# Patient Record
Sex: Female | Born: 1980 | Race: White | Hispanic: No | Marital: Married | Smoking: Never smoker
Health system: Southern US, Community
[De-identification: ages and names within clinical notes are randomized; demographics above are authoritative.]

## PROBLEM LIST (undated history)

## (undated) DIAGNOSIS — F419 Anxiety disorder, unspecified: Secondary | ICD-10-CM

## (undated) DIAGNOSIS — E282 Polycystic ovarian syndrome: Secondary | ICD-10-CM

## (undated) DIAGNOSIS — F32A Depression, unspecified: Secondary | ICD-10-CM

## (undated) DIAGNOSIS — Z8742 Personal history of other diseases of the female genital tract: Secondary | ICD-10-CM

## (undated) DIAGNOSIS — R011 Cardiac murmur, unspecified: Secondary | ICD-10-CM

## (undated) DIAGNOSIS — D649 Anemia, unspecified: Secondary | ICD-10-CM

## (undated) DIAGNOSIS — R519 Headache, unspecified: Secondary | ICD-10-CM

## (undated) DIAGNOSIS — K219 Gastro-esophageal reflux disease without esophagitis: Secondary | ICD-10-CM

## (undated) DIAGNOSIS — R51 Headache: Secondary | ICD-10-CM

## (undated) DIAGNOSIS — F329 Major depressive disorder, single episode, unspecified: Secondary | ICD-10-CM

## (undated) DIAGNOSIS — G932 Benign intracranial hypertension: Secondary | ICD-10-CM

## (undated) DIAGNOSIS — O358XX0 Maternal care for other (suspected) fetal abnormality and damage, not applicable or unspecified: Secondary | ICD-10-CM

## (undated) DIAGNOSIS — I10 Essential (primary) hypertension: Secondary | ICD-10-CM

## (undated) HISTORY — PX: GASTRECTOMY: SHX58

## (undated) HISTORY — PX: CHOLECYSTECTOMY: SHX55

---
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
## 2002-07-07 ENCOUNTER — Other Ambulatory Visit: Admission: RE | Admit: 2002-07-07 | Discharge: 2002-07-07 | Payer: Self-pay | Admitting: Gynecology

## 2003-07-22 ENCOUNTER — Emergency Department (HOSPITAL_COMMUNITY): Admission: EM | Admit: 2003-07-22 | Discharge: 2003-07-22 | Payer: Self-pay | Admitting: Emergency Medicine

## 2003-08-09 ENCOUNTER — Other Ambulatory Visit: Admission: RE | Admit: 2003-08-09 | Discharge: 2003-08-09 | Payer: Self-pay | Admitting: Gynecology

## 2004-08-15 ENCOUNTER — Other Ambulatory Visit: Admission: RE | Admit: 2004-08-15 | Discharge: 2004-08-15 | Payer: Self-pay | Admitting: Gynecology

## 2005-07-28 ENCOUNTER — Emergency Department (HOSPITAL_COMMUNITY): Admission: EM | Admit: 2005-07-28 | Discharge: 2005-07-28 | Payer: Self-pay | Admitting: *Deleted

## 2007-05-16 ENCOUNTER — Ambulatory Visit: Payer: Self-pay | Admitting: Internal Medicine

## 2007-05-16 DIAGNOSIS — F3289 Other specified depressive episodes: Secondary | ICD-10-CM | POA: Insufficient documentation

## 2007-05-16 DIAGNOSIS — F329 Major depressive disorder, single episode, unspecified: Secondary | ICD-10-CM

## 2007-05-16 DIAGNOSIS — I10 Essential (primary) hypertension: Secondary | ICD-10-CM | POA: Insufficient documentation

## 2007-05-16 DIAGNOSIS — F411 Generalized anxiety disorder: Secondary | ICD-10-CM | POA: Insufficient documentation

## 2007-06-13 ENCOUNTER — Ambulatory Visit: Payer: Self-pay | Admitting: Internal Medicine

## 2007-08-11 ENCOUNTER — Encounter: Payer: Self-pay | Admitting: Internal Medicine

## 2007-09-07 ENCOUNTER — Ambulatory Visit: Payer: Self-pay | Admitting: Internal Medicine

## 2007-09-07 DIAGNOSIS — G932 Benign intracranial hypertension: Secondary | ICD-10-CM | POA: Insufficient documentation

## 2007-09-22 ENCOUNTER — Encounter: Payer: Self-pay | Admitting: Internal Medicine

## 2007-12-06 ENCOUNTER — Ambulatory Visit: Payer: Self-pay | Admitting: Internal Medicine

## 2008-01-03 ENCOUNTER — Ambulatory Visit: Payer: Self-pay | Admitting: Internal Medicine

## 2008-02-06 ENCOUNTER — Ambulatory Visit: Payer: Self-pay | Admitting: Internal Medicine

## 2008-04-09 HISTORY — PX: CHOLECYSTECTOMY: SHX55

## 2008-05-08 ENCOUNTER — Ambulatory Visit: Payer: Self-pay | Admitting: Internal Medicine

## 2008-05-08 DIAGNOSIS — G4700 Insomnia, unspecified: Secondary | ICD-10-CM | POA: Insufficient documentation

## 2008-05-14 ENCOUNTER — Encounter (INDEPENDENT_AMBULATORY_CARE_PROVIDER_SITE_OTHER): Payer: Self-pay | Admitting: Surgery

## 2008-05-14 ENCOUNTER — Observation Stay (HOSPITAL_COMMUNITY): Admission: EM | Admit: 2008-05-14 | Discharge: 2008-05-15 | Payer: Self-pay | Admitting: Emergency Medicine

## 2008-05-20 ENCOUNTER — Inpatient Hospital Stay (HOSPITAL_COMMUNITY): Admission: EM | Admit: 2008-05-20 | Discharge: 2008-05-22 | Payer: Self-pay | Admitting: Emergency Medicine

## 2008-06-06 ENCOUNTER — Encounter: Payer: Self-pay | Admitting: Internal Medicine

## 2008-06-07 ENCOUNTER — Ambulatory Visit: Payer: Self-pay | Admitting: Internal Medicine

## 2008-06-13 ENCOUNTER — Encounter: Payer: Self-pay | Admitting: Internal Medicine

## 2008-06-27 ENCOUNTER — Telehealth: Payer: Self-pay | Admitting: Internal Medicine

## 2008-07-04 ENCOUNTER — Ambulatory Visit: Payer: Self-pay | Admitting: Internal Medicine

## 2008-07-04 DIAGNOSIS — R519 Headache, unspecified: Secondary | ICD-10-CM | POA: Insufficient documentation

## 2008-07-04 DIAGNOSIS — R51 Headache: Secondary | ICD-10-CM | POA: Insufficient documentation

## 2008-10-25 ENCOUNTER — Ambulatory Visit: Payer: Self-pay | Admitting: Internal Medicine

## 2009-04-25 ENCOUNTER — Ambulatory Visit: Payer: Self-pay | Admitting: Internal Medicine

## 2009-04-25 LAB — CONVERTED CEMR LAB
ALT: 23 units/L (ref 0–35)
BUN: 12 mg/dL (ref 6–23)
Basophils Absolute: 0 10*3/uL (ref 0.0–0.1)
Bilirubin, Direct: 0 mg/dL (ref 0.0–0.3)
Cholesterol: 186 mg/dL (ref 0–200)
Creatinine, Ser: 0.8 mg/dL (ref 0.4–1.2)
Eosinophils Relative: 1.3 % (ref 0.0–5.0)
GFR calc non Af Amer: 91.48 mL/min (ref >60)
Glucose, Bld: 94 mg/dL (ref 70–99)
HCT: 45.9 % (ref 36.0–46.0)
HDL: 43 mg/dL (ref >39.00)
LDL Cholesterol: 113 mg/dL — ABNORMAL HIGH (ref 0–99)
Lymphs Abs: 1.7 10*3/uL (ref 0.7–4.0)
MCV: 90.7 fL (ref 78.0–100.0)
Monocytes Absolute: 0.4 10*3/uL (ref 0.1–1.0)
Monocytes Relative: 6 % (ref 3.0–12.0)
Neutrophils Relative %: 64.6 % (ref 43.0–77.0)
Platelets: 216 10*3/uL (ref 150.0–400.0)
RDW: 12.7 % (ref 11.5–14.6)
TSH: 1.57 microintl units/mL (ref 0.35–5.50)
Total Bilirubin: 0.5 mg/dL (ref 0.3–1.2)
Triglycerides: 151 mg/dL — ABNORMAL HIGH (ref 0.0–149.0)
VLDL: 30.2 mg/dL (ref 0.0–40.0)
WBC: 6 10*3/uL (ref 4.5–10.5)

## 2009-07-23 ENCOUNTER — Encounter: Payer: Self-pay | Admitting: Internal Medicine

## 2009-09-16 ENCOUNTER — Encounter: Payer: Self-pay | Admitting: Internal Medicine

## 2009-11-21 ENCOUNTER — Encounter: Payer: Self-pay | Admitting: Internal Medicine

## 2009-12-19 ENCOUNTER — Encounter: Payer: Self-pay | Admitting: Internal Medicine

## 2010-04-08 NOTE — Letter (Signed)
[facility]: [facility]   Imported By: Maryln Gottron 12/30/2009 15:29:28  _____________________________________________________________________  External Attachment:    Type:   Image     Comment:   External Document

## 2010-04-08 NOTE — Letter (Signed)
Summary: [facility]  [facility]   Imported By: Maryln Gottron 09/20/2009 14:18:19  _____________________________________________________________________  External Attachment:    Type:   Image     Comment:   External Document

## 2010-04-08 NOTE — Letter (Signed)
Summary: [facility]  [facility]   Imported By: Maryln Gottron 09/23/2009 14:32:32  _____________________________________________________________________  External Attachment:    Type:   Image     Comment:   External Document

## 2010-04-08 NOTE — Assessment & Plan Note (Signed)
Summary: 6 MONTH ROV/NJR   Vital Signs:  Patient profile:   30 year old female Weight:      266 pounds Temp:     98.6 degrees F oral BP sitting:   132 / 90  (right arm) Cuff size:   large  Vitals Entered By: Duard Brady LPN (April 25, 2009 8:17 AM) CC: 6 mos ROV - needs medication Refills - c/o l ear pain   CC:  6 mos ROV - needs medication Refills - c/o l ear pain.  History of Present Illness: 30 year old patient who is in today for follow-up.  she is followed by ophthalmology for benign intracranial hypertension.  She is scheduled for a follow-up in a couple of months.  She is also followed by psychiatry for depression.  She has treated hypertension.  She is on diuretic therapy.  That includes a thiazide as well as Diamox.  She feels a bit unwell today with some vague ear discomfort and general sense of unwellness, but general has been fairly stable.  no complaints of headache  Preventive Screening-Counseling & Management  Alcohol-Tobacco     Smoking Status: never  Allergies: No Known Drug Allergies  Past History:  Past Medical History: Hypertension history of pseudotumor cerebri; LP performed in 2007 negative ([facility]) Anxiety Depression history panic disorder obesity  Past Surgical History: Reviewed history from 06/07/2008 and no changes required. negative status post laparoscopic cholecystectomy March 2010  Family History: father age 35 has coronary artery disease, hypertension, diabetes, dyslipidemia mother, age 75, is in good health as is one brother, age 58  Social History: Reviewed history from 05/16/2007 and no changes required. Single graduate at [employer]; started at [employer] and completed her graduate studies at [employer]oking Status:  never  Review of Systems  The patient denies anorexia, fever, weight loss, weight gain, vision loss, decreased hearing, hoarseness, chest pain, syncope, dyspnea on exertion, peripheral edema,  prolonged cough, headaches, hemoptysis, abdominal pain, melena, hematochezia, severe indigestion/heartburn, hematuria, incontinence, genital sores, muscle weakness, suspicious skin lesions, transient blindness, difficulty walking, depression, unusual weight change, abnormal bleeding, enlarged lymph nodes, angioedema, and breast masses.    Physical Exam  General:  overweight-appearing.  130/90overweight-appearing.   Head:  Normocephalic and atraumatic without obvious abnormalities. No apparent alopecia or balding. Eyes:  fundi with  slightly indistinct borders Ears:  External ear exam shows no significant lesions or deformities.  Otoscopic examination reveals clear canals, tympanic membranes are intact bilaterally without bulging, retraction, inflammation or discharge. Hearing is grossly normal bilaterally. Mouth:  pharyngeal erythema.  pharyngeal erythema.   Neck:  No deformities, masses, or tenderness noted. Lungs:  Normal respiratory effort, chest expands symmetrically. Lungs are clear to auscultation, no crackles or wheezes. Heart:  Normal rate and regular rhythm. S1 and S2 normal without gallop, murmur, click, rub or other extra sounds. Msk:  No deformity or scoliosis noted of thoracic or lumbar spine.   Pulses:  R and L carotid,radial,femoral,dorsalis pedis and posterior tibial pulses are full and equal bilaterally Extremities:  No clubbing, cyanosis, edema, or deformity noted with normal full range of motion of all joints.     Impression & Recommendations:  Problem # 1:  DEPRESSION (ICD-311)  Her updated medication list for this problem includes:    Lorazepam 0.5 Mg Tabs (Lorazepam) ..... One tablet once or twice daily    Her updated medication list for this problem includes:    Lorazepam 0.5 Mg Tabs (Lorazepam) ..... One tablet once or twice daily  Orders:  Venipuncture (04540) TLB-Lipid Panel (80061-LIPID) TLB-BMP (Basic Metabolic Panel-BMET) (80048-METABOL) TLB-CBC Platelet -  w/Differential (85025-CBCD) TLB-Hepatic/Liver Function Pnl (80076-HEPATIC) TLB-TSH (Thyroid Stimulating Hormone) (84443-TSH)  Problem # 2:  HYPERTENSION (ICD-401.9)  Her updated medication list for this problem includes:    Acetazolamide 500 Mg Xr12h-cap (Acetazolamide) ..... One daily    Her updated medication list for this problem includes:    Acetazolamide 500 Mg Xr12h-cap (Acetazolamide) ..... One daily  Orders: Venipuncture (98119) TLB-Lipid Panel (80061-LIPID) TLB-BMP (Basic Metabolic Panel-BMET) (80048-METABOL) TLB-CBC Platelet - w/Differential (85025-CBCD) TLB-Hepatic/Liver Function Pnl (80076-HEPATIC) TLB-TSH (Thyroid Stimulating Hormone) (84443-TSH)  Problem # 3:  BENIGN INTRACRANIAL HYPERTENSION (ICD-348.2)  follow Dr. Hazle Quant  Orders: Venipuncture (14782) TLB-Lipid Panel (80061-LIPID) TLB-BMP (Basic Metabolic Panel-BMET) (80048-METABOL) TLB-CBC Platelet - w/Differential (85025-CBCD) TLB-Hepatic/Liver Function Pnl (80076-HEPATIC) TLB-TSH (Thyroid Stimulating Hormone) (84443-TSH)  Complete Medication List: 1)  Hydrochlorothiazide 25 Mg Caps (hydrochlorothiazide)  .Marland Kitchen.. 1 once daily 2)  Lorazepam 0.5 Mg Tabs (Lorazepam) .... One tablet once or twice daily 3)  Acetazolamide 500 Mg Xr12h-cap (Acetazolamide) .... One daily 4)  Lamictal 100 Mg Tabs (Lamotrigine) .Marland Kitchen.. 1 once daily  Patient Instructions: 1)  Please schedule a follow-up appointment in 6 months. 2)  Limit your Sodium (Salt). 3)  It is important that you exercise regularly at least 20 minutes 5 times a week. If you develop chest pain, have severe difficulty breathing, or feel very tired , stop exercising immediately and seek medical attention. 4)  You need to lose weight. Consider a lower calorie diet and regular exercise.  5)  Check your Blood Pressure regularly. If it is above: 150/90  you should make an appointment. Prescriptions: ACETAZOLAMIDE 500 MG XR12H-CAP (ACETAZOLAMIDE) one daily  #90 x 6    Entered and Authorized by:   Gordy Savers  MD   Signed by:   Gordy Savers  MD on 04/25/2009   Method used:   Print then Give to Patient   RxID:   [id number] LORAZEPAM 0.5 MG TABS (LORAZEPAM) one tablet once or twice daily  #60 x 2   Entered and Authorized by:   Gordy Savers  MD   Signed by:   Gordy Savers  MD on 04/25/2009   Method used:   Print then Give to Patient   RxID:   [id number] HYDROCHLOROTHIAZIDE 25 MG  CAPS (HYDROCHLOROTHIAZIDE) 1 once daily  #90 x 6   Entered and Authorized by:   Gordy Savers  MD   Signed by:   Gordy Savers  MD on 04/25/2009   Method used:   Print then Give to Patient   RxID:   [id number]

## 2010-04-11 NOTE — Letter (Signed)
Summary: [facility]  [facility]   Imported By: Maryln Gottron 11/27/2009 14:02:42  _____________________________________________________________________  External Attachment:    Type:   Image     Comment:   External Document

## 2010-06-18 IMAGING — RF DG CHOLANGIOGRAM OPERATIVE
1 series · 4 of 4 positions shown · non-contrast
Comparison: Ultrasound [DATE]

CLINICAL DATA: Cholecystitis

INTRAOPERATIVE CHOLANGIOGRAM
TECHNIQUE: Multiple fluoroscopic spot radiographs were obtained
during intraoperative cholangiogram and are submitted for
interpretation post-operatively.

[Series 1: run · 4 of 102 frames shown]
[frame 16/102]
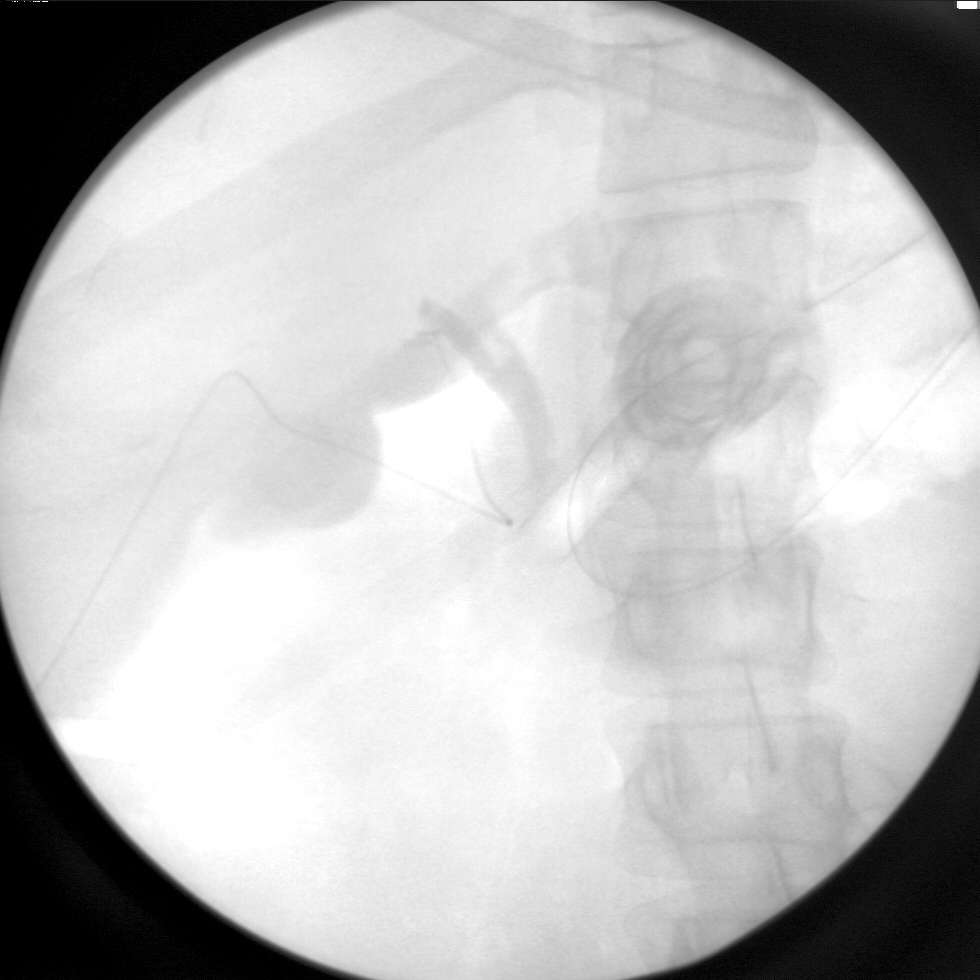
[frame 52/102]
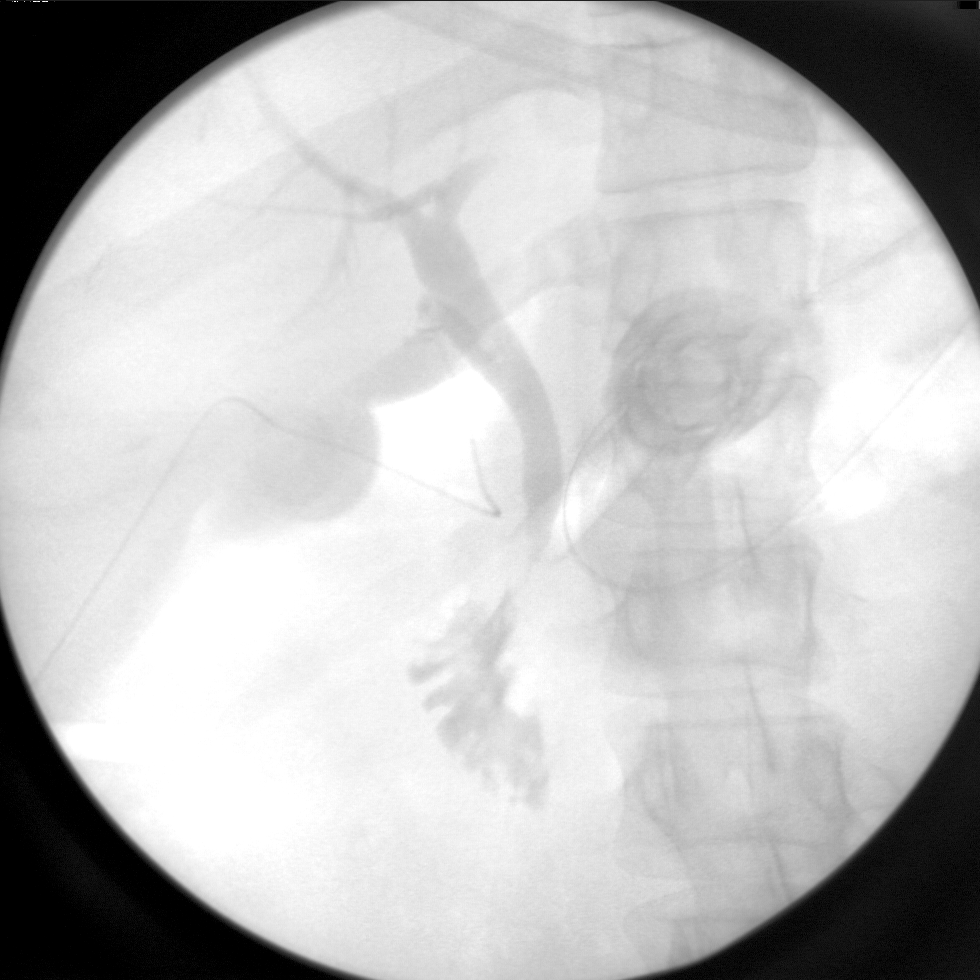
[frame 87/102]
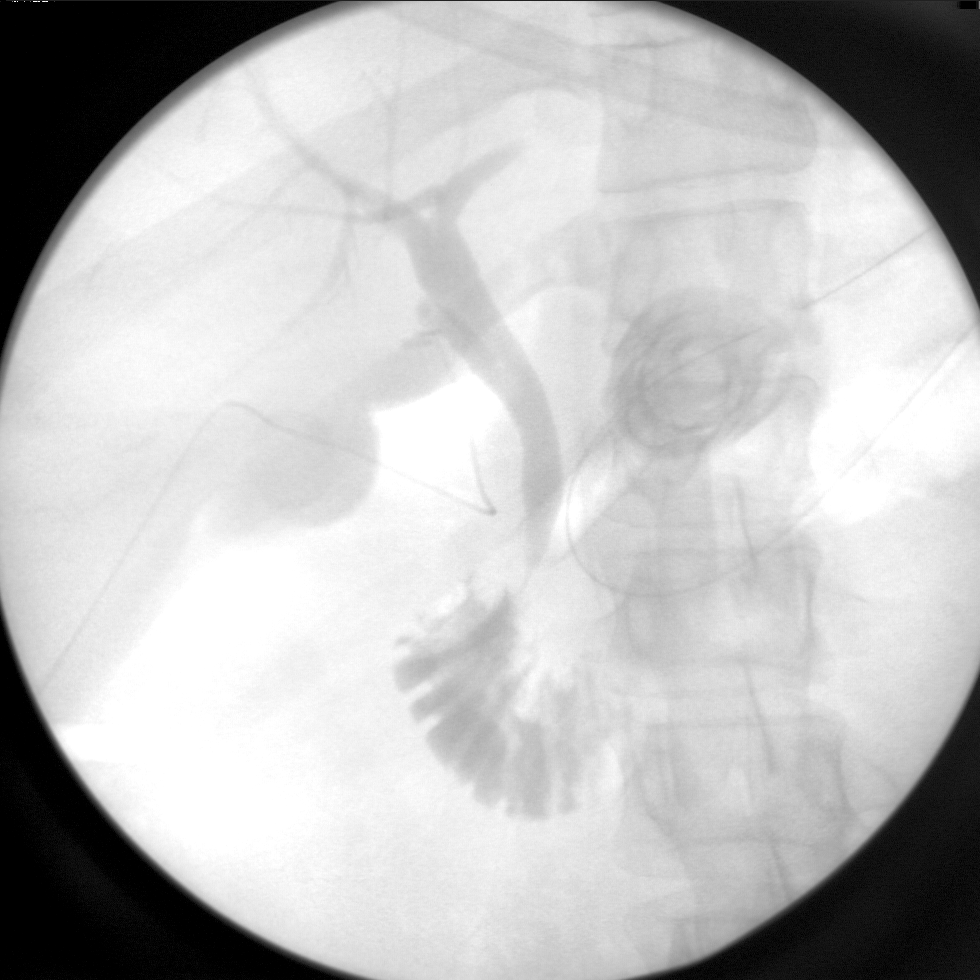
[frame 98/102]
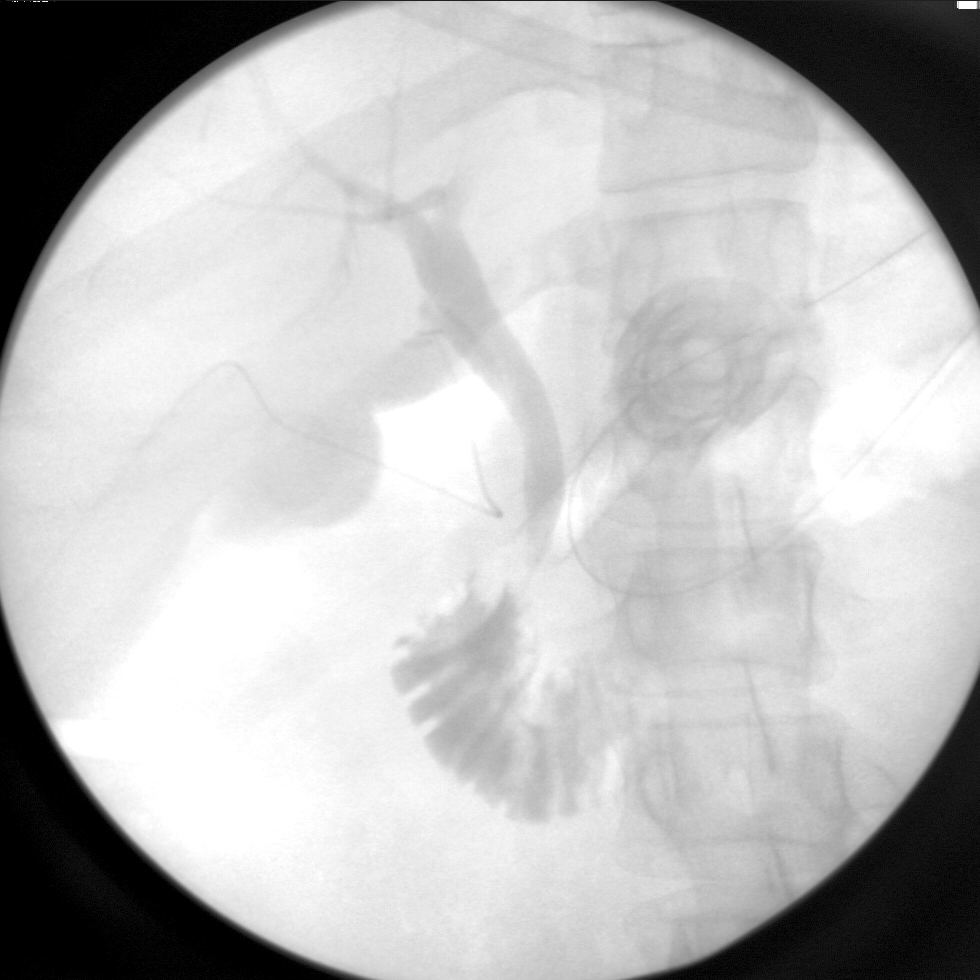

[4 of 4 positions shown; findings below may reference images not displayed]

FINDINGS: Contrast is injected through the cystic duct remnant.
There is good filling of the proximal intrahepatic ducts, the
common hepatic duct and the common bile duct.  There are no filling
defects.  No significant ductal dilatation.  Contrast flows freely
into the duodenum.  I think there is a bit of mixing artifact noted
with respect to the contrast and the bile.
IMPRESSION: No sign of retained stone or ductal obstruction.

## 2010-06-18 IMAGING — US US ABDOMEN COMPLETE
1 series · 13 of 25 positions shown · non-contrast
Comparison: None.

CLINICAL DATA: 28-year-old female with right upper quadrant pain
and vomiting.

ABDOMEN ULTRASOUND
TECHNIQUE: Complete abdominal ultrasound examination was performed
including evaluation of the liver, gallbladder, bile ducts,
pancreas, kidneys, spleen, IVC, and abdominal aorta.

[Series 1: us abdomen complete · 0.32mm/px · 13 of 60 slices shown]
[im 1/60]
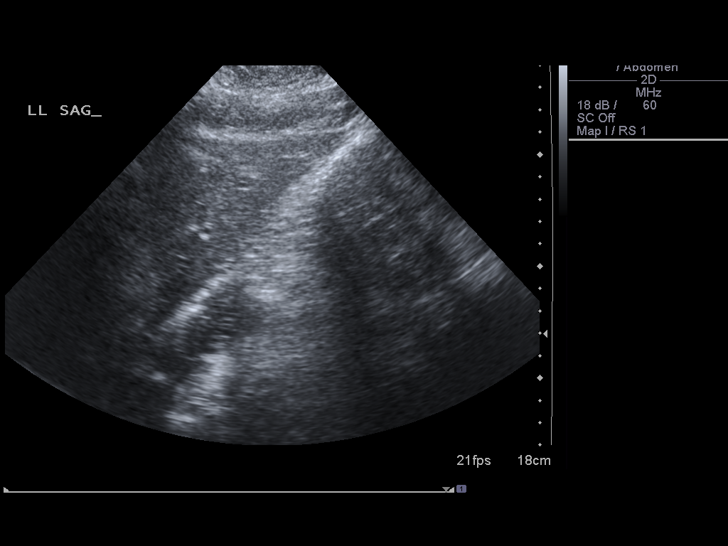
[im 5/60]
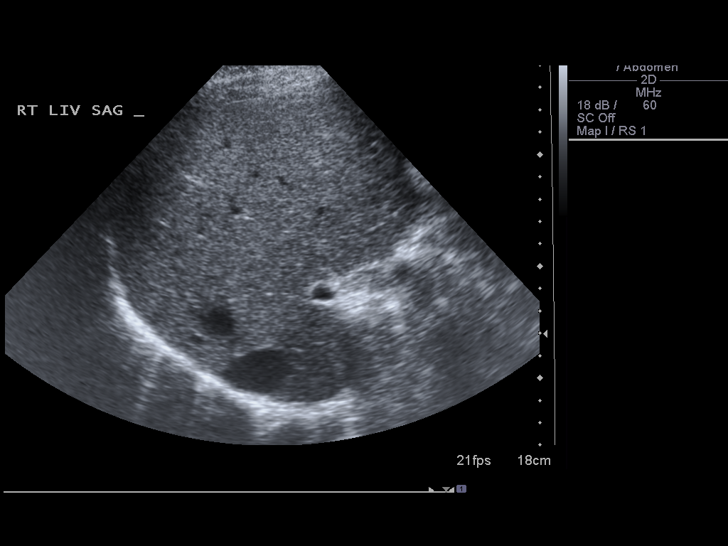
[im 10/60]
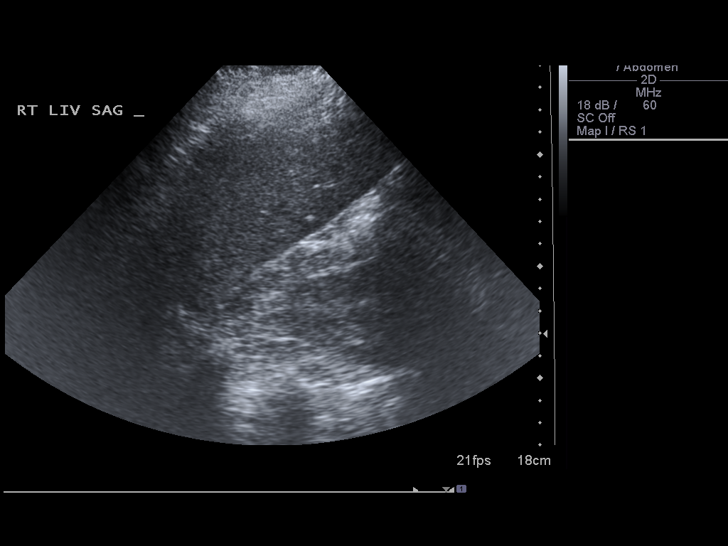
[im 15/60]
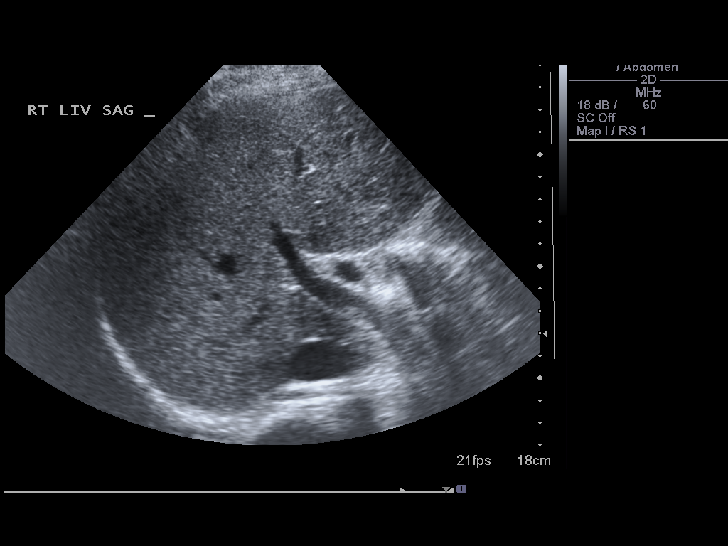
[im 20/60]
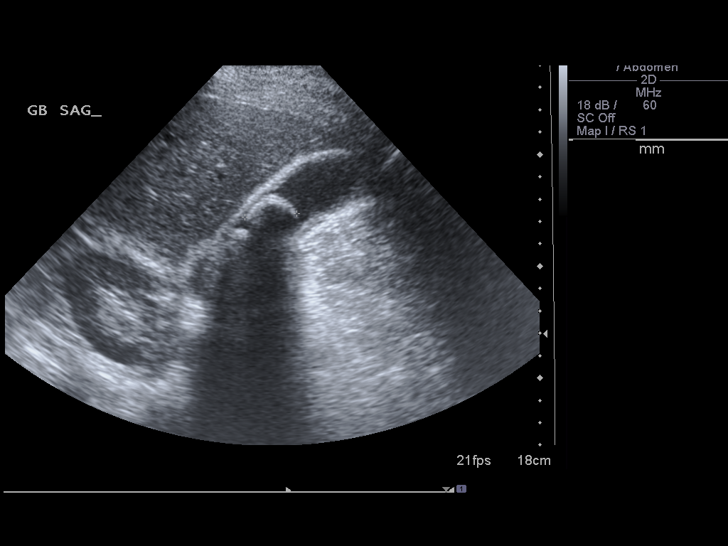
[im 25/60]
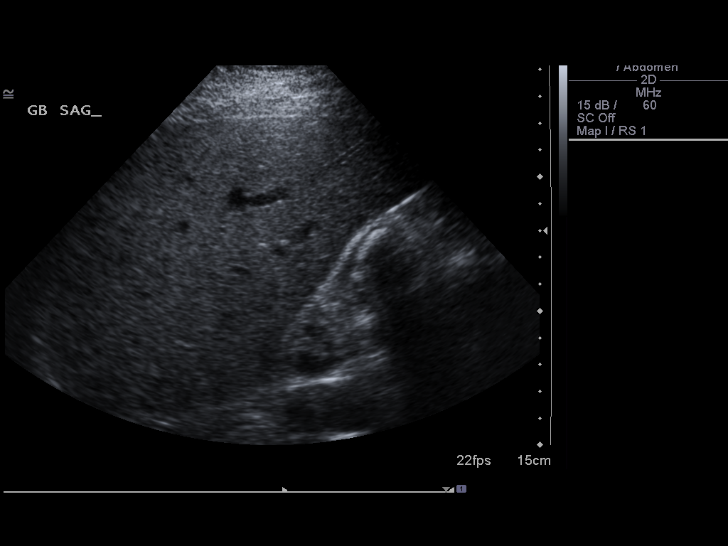
[im 30/60]
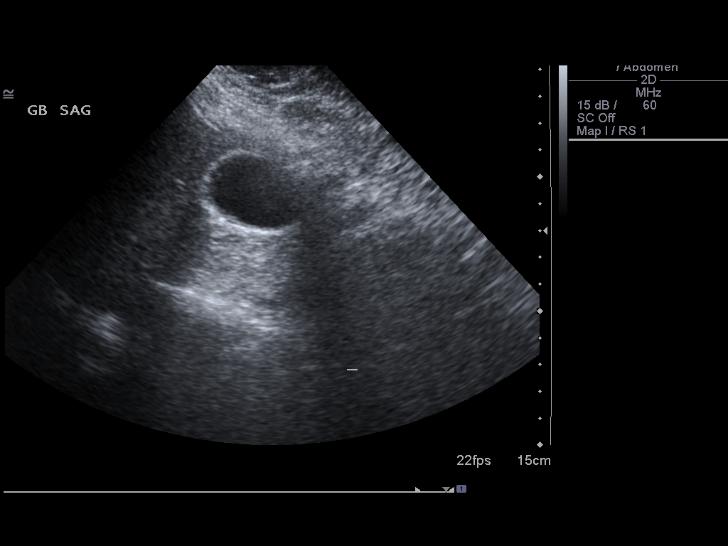
[im 35/60]
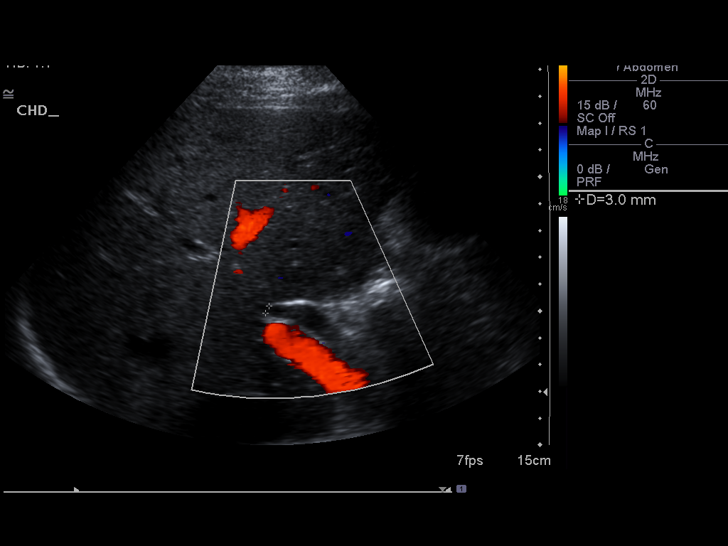
[im 40/60]
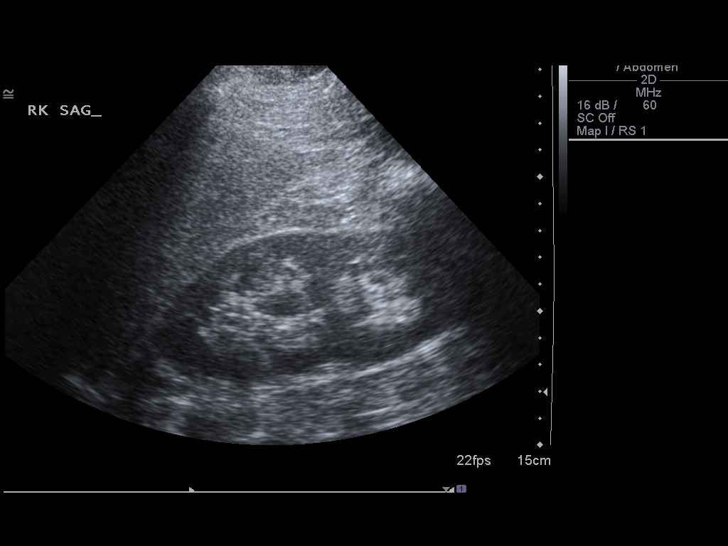
[im 45/60]
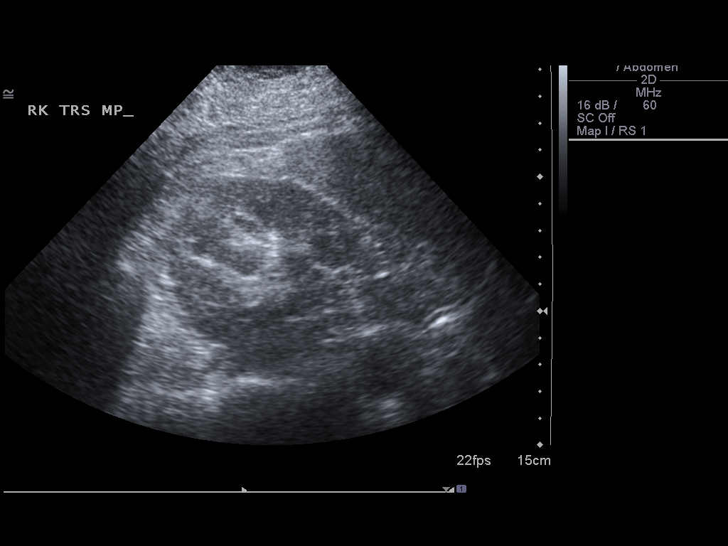
[im 50/60]
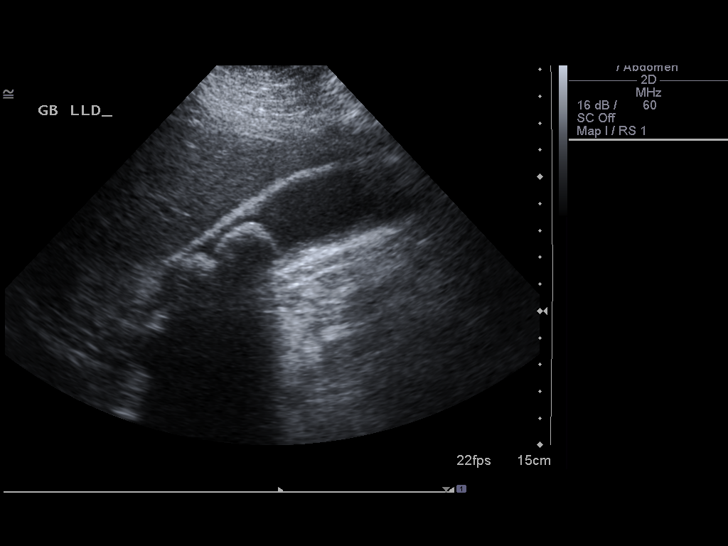
[im 55/60]
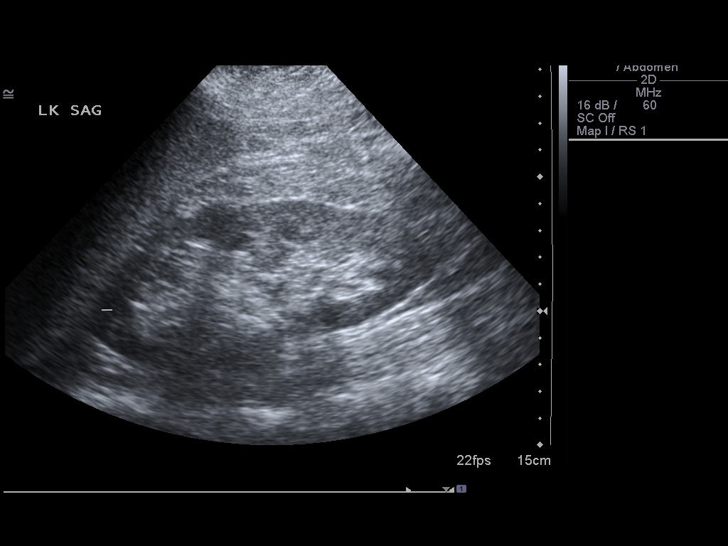
[im 60/60]
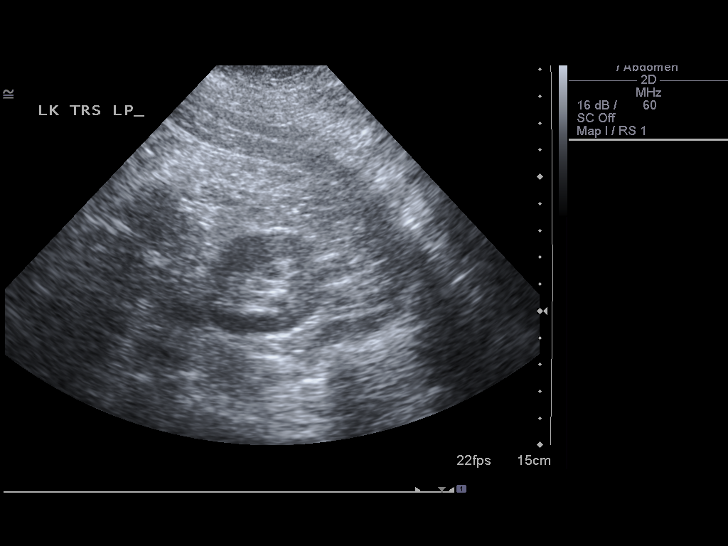

[13 of 25 positions shown; findings below may reference images not displayed]

FINDINGS: Multiple large gallstones identified with shadowing.
These measure up to 23 mm in size.  These are located in the neck
and were not mobile with changes in patient positioning.  Positive
sonographic Murphy's sign was elicited.  Gallbladder wall thickness
is at the upper limits of normal at 3 mm.  No pericholecystic
fluid.  Common bile duct is not mildly dilated at 7 mm.  No
intrahepatic biliary ductal dilatation.  Distal common bile duct
could not be visualized.

Liver parenchyma, visualized inferior vena cava and abdominal aorta
are within normal limits.  Maximal aortic diameters 2.2 cm.
Visualized pancreatic parenchyma is within normal limits (head area
could not be visualized due to overlying bowel gas).  The right
kidney is normal measuring 11.4 cm.  The spleen is within normal
limits measuring 13.5 cm in length.  The left kidney is normal
measuring 13.9 cm in length.
IMPRESSION: 1.  Acute cholecystitis.; multiple nonmobile gallstones measuring
up to 23 mm in size in the neck of the gallbladder with positive
sonographic Murphy's sign.
2.  Mildly dilated CBD without intrahepatic dilatation.
Obstructing choledocholithiasis not excluded.
3.  Other visualized abdominal viscera are within normal limits.

I discussed the above findings with Dr. Ferienhaus Erxleben by
telephone at 2266 hours on 05/14/2008.

## 2010-06-19 LAB — URINALYSIS, ROUTINE W REFLEX MICROSCOPIC
Glucose, UA: NEGATIVE mg/dL
Glucose, UA: NEGATIVE mg/dL
Ketones, ur: >80 mg/dL — AB
Leukocytes, UA: NEGATIVE
Nitrite: NEGATIVE
Specific Gravity, Urine: 1.024 (ref 1.005–1.030)
pH: 5.5 (ref 5.0–8.0)
pH: 6.5 (ref 5.0–8.0)

## 2010-06-19 LAB — DIFFERENTIAL
Basophils Relative: 0 % (ref 0–1)
Eosinophils Absolute: 0 10*3/uL (ref 0.0–0.7)
Eosinophils Relative: 0 % (ref 0–5)
Lymphocytes Relative: 12 % (ref 12–46)
Lymphs Abs: 1.1 10*3/uL (ref 0.7–4.0)
Lymphs Abs: 1.4 10*3/uL (ref 0.7–4.0)
Monocytes Absolute: 0.3 10*3/uL (ref 0.1–1.0)
Monocytes Relative: 2 % — ABNORMAL LOW (ref 3–12)
Neutro Abs: 10 10*3/uL — ABNORMAL HIGH (ref 1.7–7.7)
Neutrophils Relative %: 85 % — ABNORMAL HIGH (ref 43–77)

## 2010-06-19 LAB — URINE MICROSCOPIC-ADD ON

## 2010-06-19 LAB — HEPATIC FUNCTION PANEL
ALT: 28 U/L (ref 0–35)
AST: 28 U/L (ref 0–37)
Alkaline Phosphatase: 63 U/L (ref 39–117)
Bilirubin, Direct: 0.3 mg/dL (ref 0.0–0.3)

## 2010-06-19 LAB — CBC
HCT: 30.7 % — ABNORMAL LOW (ref 36.0–46.0)
HCT: 34.1 % — ABNORMAL LOW (ref 36.0–46.0)
Hemoglobin: 10.3 g/dL — ABNORMAL LOW (ref 12.0–15.0)
Hemoglobin: 11.6 g/dL — ABNORMAL LOW (ref 12.0–15.0)
Hemoglobin: 14.2 g/dL (ref 12.0–15.0)
MCHC: 34 g/dL (ref 30.0–36.0)
MCHC: 34.1 g/dL (ref 30.0–36.0)
MCV: 87.9 fL (ref 78.0–100.0)
MCV: 88 fL (ref 78.0–100.0)
Platelets: 213 10*3/uL (ref 150–400)
RBC: 3.88 MIL/uL (ref 3.87–5.11)
RBC: 4.8 MIL/uL (ref 3.87–5.11)
RBC: 5.06 MIL/uL (ref 3.87–5.11)
WBC: 7.5 10*3/uL (ref 4.0–10.5)
WBC: 11.8 10*3/uL — ABNORMAL HIGH (ref 4.0–10.5)
WBC: 13.4 10*3/uL — ABNORMAL HIGH (ref 4.0–10.5)
WBC: 17 10*3/uL — ABNORMAL HIGH (ref 4.0–10.5)

## 2010-06-19 LAB — LIPASE, BLOOD: Lipase: 16 U/L (ref 11–59)

## 2010-06-19 LAB — COMPREHENSIVE METABOLIC PANEL
ALT: 26 U/L (ref 0–35)
AST: 18 U/L (ref 0–37)
Alkaline Phosphatase: 47 U/L (ref 39–117)
BUN: 1 mg/dL — ABNORMAL LOW (ref 6–23)
CO2: 27 mEq/L (ref 19–32)
CO2: 25 mEq/L (ref 19–32)
Calcium: 9.8 mg/dL (ref 8.4–10.5)
Chloride: 106 mEq/L (ref 96–112)
Chloride: 101 mEq/L (ref 96–112)
Creatinine, Ser: 0.68 mg/dL (ref 0.4–1.2)
GFR calc Af Amer: >60 mL/min (ref >60)
GFR calc non Af Amer: >60 mL/min (ref >60)
GFR calc non Af Amer: >60 mL/min (ref >60)
Glucose, Bld: 101 mg/dL — ABNORMAL HIGH (ref 70–99)
Potassium: 4 mEq/L (ref 3.5–5.1)
Sodium: 137 mEq/L (ref 135–145)
Total Bilirubin: 0.5 mg/dL (ref 0.3–1.2)

## 2010-06-19 LAB — POCT I-STAT, CHEM 8
BUN: 15 mg/dL (ref 6–23)
Chloride: 104 mEq/L (ref 96–112)
Glucose, Bld: 161 mg/dL — ABNORMAL HIGH (ref 70–99)
HCT: 44 % (ref 36.0–46.0)
Potassium: 3.5 mEq/L (ref 3.5–5.1)

## 2010-06-19 LAB — PREGNANCY, URINE: Preg Test, Ur: NEGATIVE

## 2010-06-19 LAB — URINE CULTURE

## 2010-07-22 NOTE — Discharge Summary (Signed)
NAMECOUMBA, KELLISON               ACCOUNT NO.:  [id number]   MEDICAL RECORD NO.:  [id number]          PATIENT TYPE:  INP   LOCATION:  1532                         FACILITY:  [facility]   PHYSICIAN:  Velora Heckler, MD      DATE OF BIRTH:  16-Jan-1981   DATE OF ADMISSION:  05/19/2008  DATE OF DISCHARGE:  05/22/2008                               DISCHARGE SUMMARY   REASON FOR ADMISSION:  Abdominal pain, fever.   HISTORY OF PRESENT ILLNESS:  [name] is a 30 year old white female  who underwent laparoscopic cholecystectomy on March 8. 2010.  She was  discharged home the following morning.  Four days after discharge, she  developed abdominal pain and low-grade fever.  She presented to the  emergency department for assessment.   HOSPITAL COURSE:  The patient was seen in the emergency department by  Dr. Glenna Fellows.  White blood cell count was elevated at 17,000.  The patient was admitted and started empirically on antibiotic therapy.  A nuclear medicine hepatobiliary scan was performed and showed no  evidence of biliary leak.  The patient improved on antibiotic therapy.  Liver function tests were normal.  Fever resolved.  The patient was  advanced in diet and tolerated a regular diet.  She is prepared for  discharge home on the third hospital day.   DISCHARGE PLANNING:  The patient will be discharged home today, May 22, 2008, tolerating a regular diet and ambulating independently.   DISCHARGE MEDICATIONS:  Cipro and Flagyl for a 10-day course.  The  patient has narcotic analgesics at home.   FOLLOWUP:  She will return to see me at the offices of [facility] in 2 weeks.   FINAL DIAGNOSES:  1. History of cholelithiasis, status post laparoscopic      cholecystectomy.  2. Postoperative infection.   CONDITION ON DISCHARGE:  Improved.      Velora Heckler, MD  Electronically Signed     TMG/MEDQ  D:  05/22/2008  T:  05/22/2008  Job:  [id number]

## 2010-07-22 NOTE — Op Note (Signed)
[name], [name]               ACCOUNT NO.:  [id number]   MEDICAL RECORD NO.:  [id number]          PATIENT TYPE:  OBV   LOCATION:  0098                         FACILITY:  [facility]   PHYSICIAN:  Velora Heckler, MD      DATE OF BIRTH:  [birth year]   DATE OF PROCEDURE:  05/14/2008  DATE OF DISCHARGE:                               OPERATIVE REPORT   PREOPERATIVE DIAGNOSIS:  Symptomatic cholelithiasis.   POSTOPERATIVE DIAGNOSIS:  Symptomatic cholelithiasis.   PROCEDURE:  Laparoscopic cholecystectomy with intraoperative  cholangiography.   SURGEON:  Velora Heckler, MD, FACS   ASSISTANT:  Ovidio Kin, MD, FACS   ANESTHESIA:  General.   ESTIMATED BLOOD LOSS:  Minimal.   PREPARATION:  Betadine.   COMPLICATIONS:  None.   INDICATIONS:  The patient is a 30 year old white female who presents to  the emergency department with unrelenting right upper quadrant abdominal  pain, nausea and vomiting.  The patient was seen and evaluated by Dr.  Baruch Merl.  Ultrasound showed multiple gallstones.  The patient is  now prepared and brought to the operating room for cholecystectomy for  symptomatic cholelithiasis.   BODY OF REPORT:  Procedure is done in OR #1 at the [facility].  The patient is brought to the operating room, placed in  supine position on the operating room table.  Following administration  of general anesthesia, the patient is prepped and draped in usual strict  aseptic fashion.  After ascertaining that an adequate level of  anesthesia been achieved, an infraumbilical incision was made the  midline with a #15 blade.  Dissection is carried down through  subcutaneous tissues.  Fascia is incised in the midline and the  peritoneal cavity was entered cautiously.  A 0 Vicryl pursestring suture  was placed in the fascia.  A Hasson cannula was introduced under direct  vision and secured with a pursestring suture.  Abdomen was insufflated  with carbon dioxide.   Laparoscope was introduced and the abdomen  explored.  Operative ports were placed in the right upper quadrant in  the midline, midclavicular line, anterior axillary line.  Gallbladder  was markedly distended.  It appears thick-walled and edematous.  There  are omental adhesions to the gallbladder.  The gallbladder was so  distended as to be difficult to grasp.  Therefore it is punctured with  an aspirating trocar and fluid is evacuated.  Gallbladder was then  grasped and retracted cephalad.  Omental adhesions were taken down with  blunt dissection and hemostasis obtained with electrocautery.  Dissection was carried down to the neck of the gallbladder.  Peritoneum  was incised.  Cystic duct is dissected out along its length.  A clip was  placed at the junction of the cystic duct and the gallbladder.  Cystic  duct is incised and clear yellow bile emanates from the cystic duct.  A  Cook cholangiography catheter was inserted through a stab wound in the  right upper quadrant.  It is advanced into the cystic duct and secured  with a Ligaclip.  Using C-arm fluoroscopy, real time cholangiography  was  performed.  There is rapid filling of a prominent common bile duct.  There is reflux of contrast into the right and left hepatic ductal  systems.  There is free flow distally into the duodenum without filling  defect or obstruction.  Clip was withdrawn and Cook catheter was removed  from the peritoneal cavity.  Cystic duct is triply clipped and divided.  Cystic artery was dissected out, doubly clipped, and divided.  Gallbladder was then excised from the gallbladder bed using hook  electrocautery for hemostasis.  Gallbladder was completely excised and  placed into an EndoCatch bag.  It is withdrawn through the umbilical  port.  It contains numerous gallstones ranging from several millimeters  to approximately 3 to 3.5 cm in size.  Right upper quadrant was  copiously irrigated with warm saline.  Fluid  is evacuated.  Hemostasis  was obtained in the gallbladder bed with electrocautery.  Ports were  removed under direct vision and good hemostasis was noted at all port  sites.  Zero Vicryl pursestring sutures tied securely.  Pneumoperitoneum  was released.  Port sites were anesthetized with local anesthetic.  Wounds were closed with interrupted 4-0 Vicryl subcuticular sutures.  Wounds washed and dried and Benzoin and Steri-Strips are applied.  Sterile dressings are applied.  The patient is awakened from anesthesia  and brought to the recovery room in stable condition.  The patient  tolerated the procedure well.      Velora Heckler, MD  Electronically Signed     TMG/MEDQ  D:  05/14/2008  T:  05/15/2008  Job:  [id number]

## 2010-07-22 NOTE — H&P (Signed)
[name], [name]               ACCOUNT NO.:  [id number]   MEDICAL RECORD NO.:  [id number]          PATIENT TYPE:  INP   LOCATION:  0102                         [facility]ACILITY:  [facility]   PHYSICIAN:  Diana Ras, MD   DATE OF BIRTH:  1980-11-21   DATE OF ADMISSION:  05/14/2008  DATE OF DISCHARGE:                              HISTORY & PHYSICAL   ADMISSION DIAGNOSES:  1. Acute cholecystitis.  2. Abdominal pain.  3. Nausea.  4. Vomiting.   PRIMARY CARE DOCTOR:  Diana Savers, MD   HISTORY OF PRESENT ILLNESS:  The patient is a 30 year old obese white  female with about a 2-day history of nausea, vomiting, and right upper  quadrant abdominal pain worsening last night.  She presented to the  emergency room where she underwent lab work which showed a white count  of 11.8000 normal liver function test and ultrasound showing multiple  large gallstones with shadowing and in the neck of the gallbladder.  They were not mobile and she had a positive sonographic Murphy sign.  There is no pericholecystic fluid.  Electrolytes are also within normal  limits and lipase is within normal limits.  Pregnancy test is normal.   PAST MEDICAL HISTORY:  Significant for pseudotumor cerebri and  hypertension.   MEDICATIONS:  Include hydrochlorothiazide and Diamox.   ALLERGIES:  She has no known drug allergies.   SOCIAL HISTORY:  Positive for occasional alcohol use and negative for  tobacco use.   PHYSICAL EXAMINATION:  GENERAL: She is an age-appropriate morbidly obese  white female in minimal distress.  VITAL SIGNS: Her temperature is 97.7, heart rate is 71, respiratory rate  is 18, blood pressure is 141/79.  HEENT: Exam is benign.  Normocephalic, atraumatic.  Pupils are equal,  round, and reactive to light.  Oral mucosa is within normal limits.  NECK: Supple and soft without thyromegaly or cervical adenopathy.  LUNGS: Clear to auscultation and percussion x2.  HEART: Regular rate and rhythm  without murmurs, rubs, or gallops.  ABDOMEN: Morbidly obese, but tender in the right upper quadrant.  There  are no surgical scars.  There is no hepatosplenomegaly.  EXTREMITY EXAM: Shows no clubbing, cyanosis or edema.  NEUROLOGIC EXAM: Grossly intact.  PSYCHIATRIC EXAM: Shows good insight and judgment.  She is oriented to  time, place, and person and accompanied by her father and older brother.   IMPRESSION:  Acute cholecystitis.   PLAN:  Laparoscopic cholecystectomy today either by myself or Dr.  Gerrit Gillespie.  Informed consent was granted.  He explained the risks of  bleeding, infection, conversion to open injury to the surrounding  structures.  Patient understands and wishes to proceed.      Diana Ras, MD  Electronically Signed     KRE/MEDQ  D:  05/14/2008  T:  05/14/2008  Job:  [id number]   cc:   Diana Savers, MD  [address] [place]  [place] [address]

## 2010-07-22 NOTE — H&P (Signed)
NAMEPARMINDER, Gillespie               ACCOUNT NO.:  [id number]   MEDICAL RECORD NO.:  [id number]          PATIENT TYPE:  INP   LOCATION:  1532                         FACILITY:  [facility]   PHYSICIAN:  Sharlet Salina T. Hoxworth, M.D.DATE OF BIRTH:  Dec 07, 1980   DATE OF ADMISSION:  05/19/2008  DATE OF DISCHARGE:                              HISTORY & PHYSICAL   CHIEF COMPLAINT:  Abdominal pain.   HISTORY OF PRESENT ILLNESS:  [name] is a 30 year old white female  who is status post apparently uncomplicated laparoscopic cholecystectomy  on May 14, 2008.  She was discharged the following day.  She gradually  felt better until about 24 hours ago.  At that time she had the onset of  fever and then abdominal pain.  She describes the pain as periumbilical,  fairly constant discomfort which can become severe at times.  She has  had a cough without sputum production and has some pain in the right  upper quadrant when she coughs.  She has not had any appetite since  surgery but has become somewhat nauseated without vomiting over the past  24 hours.  No urinary burning, frequency, although she is urinating less  than usual.  She presented to the emergency room today for evaluation.   PAST MEDICAL HISTORY:  Medically she has a history of hypertension and  pseudotumor cerebri.   MEDICATIONS:  Regular hydrochlorothiazide and Diamox.   ALLERGIES:  None.   SOCIAL HISTORY:  No cigarettes.  Occasional alcohol.   FAMILY HISTORY:  Noncontributory.   REVIEW OF SYSTEMS:  Negative except as above.   PHYSICAL EXAM:  VITAL SIGNS:  Temperature is 100, pulse was initially  140 now 118, respirations 18, blood pressure 160/85. GENERAL:  She is an  obese female, pleasant, alert, appears uncomfortable.  SKIN:  Flushed, warm and dry.  No rash or infection.  HEENT: No palpable masses.  Oropharynx clear.  No scleral icterus.  LYMPH:  Nodes nonpalpable.  LUNGS: Clear without wheezing or increased work of  breathing.  CARDIAC:  Regular tachycardia.  No murmurs.  No edema.  No JVD.  ABDOMEN:  Healing incisions without evidence of infection.  There is  some bruising around the umbilicus.  There is tenderness across the  upper abdomen with some guarding.  Tenderness just around the umbilical  incision.  No evidence of hernia.  EXTREMITIES: No joint swelling or deformity.  NEUROLOGIC:  Alert, oriented.  Motor and sensory exams are grossly  normal.   LABORATORY AND X-RAY:  White count elevated at 17,000, hemoglobin 15.1.  Electrolytes normal. BUN and creatinine normal.  LFTs all normal except  bilirubin 1.4, lipase is 16.  Urinalysis shows 3-6 white cells 11-20 red  cells, positive nitrite and trace leukocyte esterase. Chest x-ray shows  small pleural effusion on the right. CT scan shows small pleural  effusion on the right and some fluid and stranding in the gallbladder  fossa, somewhat greater than typically seen. No other abnormalities.   ASSESSMENT/PLAN:  Abdominal pain and fever following laparoscopic  cholecystectomy.  She has a urinary tract infection, but I do not  think  this could explain all her symptoms.  She may have a bile leak.  The  patient will be admitted for IV fluids, pain and nausea control and will  start broad-spectrum antibiotics.  Urine has been cultured.  Will obtain  a HIDA scan to evaluate for bile leak.      Lorne Skeens. Hoxworth, M.D.  Electronically Signed     BTH/MEDQ  D:  05/20/2008  T:  05/20/2008  Job:  [id number]

## 2014-05-07 LAB — US OB COMP LESS 14 WKS

## 2014-05-15 ENCOUNTER — Other Ambulatory Visit (HOSPITAL_COMMUNITY): Payer: Self-pay | Admitting: Obstetrics

## 2014-05-15 DIAGNOSIS — IMO0001 Reserved for inherently not codable concepts without codable children: Secondary | ICD-10-CM

## 2014-05-15 DIAGNOSIS — O358XX1 Maternal care for other (suspected) fetal abnormality and damage, fetus 1: Principal | ICD-10-CM

## 2014-05-22 ENCOUNTER — Encounter (HOSPITAL_COMMUNITY): Payer: Self-pay

## 2014-05-22 ENCOUNTER — Ambulatory Visit (HOSPITAL_COMMUNITY)
Admission: RE | Admit: 2014-05-22 | Discharge: 2014-05-22 | Disposition: A | Discharge status: Home / Self Care | Payer: PRIVATE HEALTH INSURANCE | Source: Ambulatory Visit | Attending: Obstetrics | Admitting: Obstetrics

## 2014-05-22 DIAGNOSIS — O358XX0 Maternal care for other (suspected) fetal abnormality and damage, not applicable or unspecified: Principal | ICD-10-CM

## 2014-05-22 DIAGNOSIS — IMO0001 Reserved for inherently not codable concepts without codable children: Secondary | ICD-10-CM

## 2014-05-22 DIAGNOSIS — Z3A12 12 weeks gestation of pregnancy: Secondary | ICD-10-CM | POA: Diagnosis not present

## 2014-05-22 DIAGNOSIS — Z369 Encounter for antenatal screening, unspecified: Secondary | ICD-10-CM | POA: Insufficient documentation

## 2014-05-22 DIAGNOSIS — O99210 Obesity complicating pregnancy, unspecified trimester: Secondary | ICD-10-CM | POA: Insufficient documentation

## 2014-05-22 DIAGNOSIS — O283 Abnormal ultrasonic finding on antenatal screening of mother: Secondary | ICD-10-CM | POA: Insufficient documentation

## 2014-05-22 DIAGNOSIS — IMO0002 Reserved for concepts with insufficient information to code with codable children: Secondary | ICD-10-CM | POA: Insufficient documentation

## 2014-05-22 DIAGNOSIS — O358XX1 Maternal care for other (suspected) fetal abnormality and damage, fetus 1: Secondary | ICD-10-CM

## 2014-05-22 DIAGNOSIS — Z315 Encounter for genetic counseling: Secondary | ICD-10-CM | POA: Insufficient documentation

## 2014-05-22 HISTORY — DX: Essential (primary) hypertension: I10

## 2014-05-22 HISTORY — DX: Major depressive disorder, single episode, unspecified: F32.9

## 2014-05-22 HISTORY — DX: Depression, unspecified: F32.A

## 2014-05-23 ENCOUNTER — Other Ambulatory Visit (HOSPITAL_COMMUNITY): Payer: Self-pay | Admitting: Obstetrics

## 2014-05-23 DIAGNOSIS — O99211 Obesity complicating pregnancy, first trimester: Secondary | ICD-10-CM

## 2014-05-23 DIAGNOSIS — Z3A14 14 weeks gestation of pregnancy: Secondary | ICD-10-CM

## 2014-05-23 DIAGNOSIS — O358XX1 Maternal care for other (suspected) fetal abnormality and damage, fetus 1: Secondary | ICD-10-CM

## 2014-05-23 DIAGNOSIS — IMO0001 Reserved for inherently not codable concepts without codable children: Secondary | ICD-10-CM

## 2014-05-23 DIAGNOSIS — Z3A18 18 weeks gestation of pregnancy: Secondary | ICD-10-CM

## 2014-05-24 DIAGNOSIS — IMO0002 Reserved for concepts with insufficient information to code with codable children: Secondary | ICD-10-CM | POA: Insufficient documentation

## 2014-05-24 NOTE — Progress Notes (Signed)
Genetic Counseling  High-Risk Gestation Note  Appointment Date:  05/22/2014 Referred By: Diana Gell, MD Date of Birth:  [birth year] Partner:  [name]   Pregnancy History: G1P0 Estimated Date of Delivery: 11/30/14 Estimated Gestational Age: 36w4dAttending: MRenella Cunas MD    I met with Mrs. Diana Humphreyand her husband, [name] for genetic counseling because of abnormal ultrasound findings. [employer] Genetic Counseling Intern, EEarley Abide assisted with genetic counseling under my direct supervision.   We began by reviewing the ultrasound in detail. 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 Additionally, a small omphalocele was visualized. Ultrasound today also questioned little fetal movement and possibly abnormal positioning of limbs. No hydrops was visualized at this time. Complete ultrasound results reported separately.   They were counseled that a cystic hygroma describes a septated fluid filled sac at the back of the neck that typically results from failure of the fetal lymphatic system.  We discussed the various etiologies for a hygroma including normal variation (immature lymphatic system), a chromosome condition, single gene condition, or a congenital anomaly (heart defect). We discussed the 50-60% ultrasound adjusted risk for a chromosome condition based on the finding of a cystic hygroma. We also discussed that a cystic hygroma can be a feature of an underlying single gene condition, such as Noonan syndrome or SLOS. Congenital heart defects are commonly associated with cystic hygromas.    We discussed that omphalocele is defined as the protrusion of abdominal viscera through the umbilical ring, covered by membrane. This defect is thought to arise from failure of lateral body migration and body wall closure or from the embryonic persistence of the body stalk. We discussed that about two thirds  of all cases have associated anomalies, most commonly including: cardiac defects, neural tube defects, and cleft lip with or without palate. We reviewed the common causes of omphaloceles, including sporadic, multifactorial, and genetic etiologies. Omphaloceles are associated with a 30-50% risk of fetal aneuploidy (trisomies 13/18), other chromosome aberrations (microdeletions, microduplications, translocations, insertions), complexes such as OEIS, and genetic syndromes including Beckwith-Wiedemann syndrome (BWS) and specific single gene conditions.   We reviewed chromosomes, nondisjunction, genes, and patterns of inheritance. Regarding chromosome conditions, we specifically discussed the common features of Down syndrome, Turner syndrome, and trisomies 13 and 18. Given the ultrasound findings of cystic hygroma and omphalocele, we specifically reviewed the associations with trisomy 126and 112 We discussed the association with ultrasound findings and smaller chromosome aberrations (deletions, duplications, insertions).   Mrs. [name] previously had noninvasive prenatal screening (NIPS)/cell free DNA performed through her OB office. We reviewed that this testing, InformaSeq through [facility] was within normal range for the conditions screened. We reviewed the technology of this screen and the detection rates for each of the conditions it screens: 99% for trisomy 21, 98% for trisomy 18, 98% for trisomy 13, and 95% for Turner syndrome. The couple understands that while this screening is highly specific and sensitive, this screening is not considered diagnostic for these conditions and does not assess for all chromosome conditions. NIPS technology also cannot assess for single gene conditions.   We discussed an additional NIPS platform available through a different laboratory ([facility] through [employer] which assesses chromosome material from all chromosomes and thus, would screen for additional chromosome  conditions. We reviewed that this reportedly detects 95% of deletions or duplications which are 7Mb or greater and also screens for select microdeletions. We reviewed the benefits and limitations of this screening  option. This couple was then counseled regarding the availability of chorionic villus sampling (CVS) until [redacted] weeks gestation and amniocentesis after [redacted] weeks gestation, including the associated risks, benefits, and limitations. We reviewed the associated 1 in 371 risk for complications for CVS and the associated 1 in 062-694 risk for complications for amniocentesis. They understand that chromosome analysis can be performed both prenatally and postnatally (peripheral blood or cord blood). Additionally, we discussed the availability of microarray analysis, which can also be performed pre and postnatally. They were counseled that microarray analysis is a molecular based technique in which a test sample of DNA (fetal) is compared to a reference (normal) genome in order to determine if the test sample has any extra or missing genetic information. Microarray analysis allows for the detection of genetic deletions and duplications that are 854 times smaller than those identified by routine chromosome analysis. We discussed available medical literature show that approximately 6% of patients with an abnormal fetal ultrasound and a normal fetal karyotype had a significant microdeletion/microduplication detected by prenatal microarray analysis. We reviewed that microarray analysis could potentially detect a deletion or duplication that is not associated with the ultrasound findings or has unknown associations. We discussed that prenatal diagnosis is available for some single gene conditions, but that in most cases targeted analysis is recommended by a medical geneticist following a post-natal physical exam and review of medical records.  If however, ultrasound findings or a positive family history strongly suggest an  increased risk for a specific condition, testing may be performed prenatally.  After thoughtful consideration of their options, they declined a second NIPS ([facility]) and declined CVS. They elected to return on 06/12/14 for amniocentesis for karyotype and microarray analysis.   We discussed that congenital anomalies can also result from teratogenic exposures. Mrs. [name] denied the use of illicit substances or alcohol during this pregnancy. Mrs. [name] is taking prozac in the pregnancy. Prozac use during pregnancy does not appear to increase the risk for birth defects.  Using Prozac late in pregnancy can increase the chance for a mild transient neonatal syndrome of central nervous system including irritability, vomiting, jitteriness and convulsions, as well as neonatal pulmonary hypertension.  These associated symptoms typically do not appear to occur frequently or severely.  We discussed management and prognosis. We discussed that the fetal prognosis is largely dependent upon the underlying cause of the hygroma and omphalocele, but that there are indications by ultrasound, such as hydrops, which significantly increase the likelihood of a poor prognosis. We specifically discussed that the presence of multiple ultrasound findings increases the chance for a single underlying genetic or chromosomal etiology.  They understand that cystic hygromas may worsen and progress to hydrops fetalis, improve and regress, or remain unchanged at follow up ultrasounds.  We discussed options for continued management in pregnancy. We also reviewed the option of termination of pregnancy. The couple understands that the legal limit for TOP in [place] is [redacted] weeks gestation. We reviewed the option of meeting with a pediatric surgeon to review the surgical approach and postnatal management of omphalocele. In addition, we also discussed the importance of delivering in a tertiary care center, to optimize care of the newborn. We  also discussed the recommendation for fetal echocardiogram, given the increased association with CHDs with both cystic hygroma and omphaloceles. Detailed ultrasound is scheduled for 07/04/14.   Both family histories were reviewed and found to be contributory for recurrent pregnancy loss for the patient's paternal aunt. She reportedly had five pregnancies  which were late miscarriages/stillbirths. She has no children, and the patient did not have information regarding whether or not an underlying cause was determined for the losses. Approximately 1 in 6 confirmed pregnancies results in miscarriage. A single underlying cause is more likely to be suspected when a couple has experienced 3 or more losses. It is less likely that there will be an identifiable single underlying cause when a couple has experienced less than 3 losses. We discussed several possible causes including chromosome rearrangements, antibodies, and thrombophilia. In approximately 3-8% of couples with recurrent pregnancy loss, one partner carries a chromosome variant, such as a balanced translocation. For most cases, a family history of recurrent pregnancy loss would not likely have implications for relatives. However, additional information is needed regarding the underlying cause for the aunt's losses in order to accurately assess potential implications for relatives.   Additionally, the father of the pregnancy reported a female paternal first cousin with Down syndrome. We discussed that 95% of cases of Down syndrome are not inherited and are the result of non-disjunction.  Three to 4% of cases of Down syndrome are the result of a translocation involving chromosome #21.  We discussed the option of chromosome analysis to determine if an individual is a carrier of a balanced translocation involving chromosome #21.  If an individual carries a balanced translocation involving chromosome #21, then the chance to have a baby with Down syndrome would be  greater than the maternal age-related risk.  Given the reported family history, this relative's Down syndrome was most likely sporadic. However, additional information regarding their karyotype and the family history may further refine recurrence risk assessment. Without further information regarding the provided family history, an accurate genetic risk cannot be calculated. Further genetic counseling is warranted if more information is obtained.  Mrs. [name] was provided with written information regarding cystic fibrosis (CF) including the carrier frequency and incidence in the Caucasian population, the availability of carrier testing and prenatal diagnosis if indicated.  In addition, we discussed that CF is routinely screened for as part of the [place] newborn screening panel.  She reportedly previously had CF carrier screening, which was negative.    Mrs. [name] denied exposure to environmental toxins or chemical agents. She denied the use of alcohol, tobacco or street drugs. She denied significant viral illnesses during the course of her pregnancy. Her medical and surgical histories were contributory for pseudotumor cerebri and hypertension.   I counseled this couple regarding the above risks and available options.  The approximate face-to-face time with the genetic counselor was 40 minutes.  Chipper Oman, MS Certified Genetic Counselor 05/24/2014

## 2014-06-12 ENCOUNTER — Ambulatory Visit (HOSPITAL_COMMUNITY)
Admission: RE | Admit: 2014-06-12 | Discharge: 2014-06-12 | Disposition: A | Discharge status: Home / Self Care | Payer: PRIVATE HEALTH INSURANCE | Source: Ambulatory Visit | Attending: Obstetrics | Admitting: Obstetrics

## 2014-06-12 ENCOUNTER — Encounter (HOSPITAL_COMMUNITY): Payer: Self-pay

## 2014-06-12 ENCOUNTER — Other Ambulatory Visit (HOSPITAL_COMMUNITY): Payer: Self-pay | Admitting: Obstetrics

## 2014-06-12 DIAGNOSIS — IMO0001 Reserved for inherently not codable concepts without codable children: Secondary | ICD-10-CM

## 2014-06-12 DIAGNOSIS — O99211 Obesity complicating pregnancy, first trimester: Secondary | ICD-10-CM

## 2014-06-12 DIAGNOSIS — Z3A15 15 weeks gestation of pregnancy: Secondary | ICD-10-CM | POA: Diagnosis not present

## 2014-06-12 DIAGNOSIS — O283 Abnormal ultrasonic finding on antenatal screening of mother: Secondary | ICD-10-CM | POA: Diagnosis not present

## 2014-06-12 DIAGNOSIS — O358XX1 Maternal care for other (suspected) fetal abnormality and damage, fetus 1: Secondary | ICD-10-CM

## 2014-06-12 DIAGNOSIS — O358XX0 Maternal care for other (suspected) fetal abnormality and damage, not applicable or unspecified: Secondary | ICD-10-CM | POA: Diagnosis present

## 2014-06-12 DIAGNOSIS — Z3A14 14 weeks gestation of pregnancy: Secondary | ICD-10-CM

## 2014-06-12 LAB — ROUTINE CHROMOSOME - KARYOTYPE + FISH

## 2014-06-12 MED ORDER — RHO D IMMUNE GLOBULIN 1500 UNIT/2ML IJ SOSY
300.0000 ug | PREFILLED_SYRINGE | Freq: Once | INTRAMUSCULAR | Status: AC
Start: 2014-06-12 — End: 2014-06-12
  Administered 2014-06-12: 300 ug via INTRAMUSCULAR
  Filled 2014-06-12: qty 2

## 2014-06-13 LAB — RH IG WORKUP (INCLUDES ABO/RH)
ABO/RH(D): O NEG
ANTIBODY SCREEN: POSITIVE
DAT, IgG: NEGATIVE
Gestational Age(Wks): 15.4
Unit division: 0

## 2014-06-14 ENCOUNTER — Other Ambulatory Visit (HOSPITAL_COMMUNITY): Payer: Self-pay | Admitting: Obstetrics

## 2014-06-14 ENCOUNTER — Telehealth (HOSPITAL_COMMUNITY): Payer: Self-pay | Admitting: MS"

## 2014-06-14 NOTE — Telephone Encounter (Signed)
Left message for patient that preliminary results are available for her and for patient to return call when she is available to talk.   [name] 06/14/2014  10:21 AM     Patient notified by Despina Ariashristy Stanley, CGC on Wednesday (4/6) afternoon [name] from the lab until morning of 4/7.

## 2014-06-14 NOTE — Telephone Encounter (Signed)
Called [name] to discuss the preliminary FISH results from her amniocentesis. 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  We again discussed the limitations of FISH and that final results are still pending and will be available in 1-2 weeks. We reviewed that final karyotype will further delineate between nondisjunction or translocation type of Trisomy 5018, which will help refine recurrence risk for the couple. Ms. [name] stated that she feels like she is doing OK with all of the information. She stated that she is at least glad that there is an explanation for the ultrasound findings. She stated that she plans to return to her OB office on 4/19 for fetal heart tones check. If there has not been a demise at that point, then she and her partner will then decide regarding termination of pregnancy.  Patient reported that she has not noticed any concerning symptoms or complications following the procedure.  All questions were answered to her satisfaction, she was encouraged to call with additional questions or concerns.  Quinn PlowmanKaren Inaaya Vellucci, MS Certified Genetic Counselor 06/14/2014 12:02 PM

## 2014-06-18 ENCOUNTER — Other Ambulatory Visit: Payer: Self-pay | Admitting: Obstetrics

## 2014-06-18 DIAGNOSIS — Z419 Encounter for procedure for purposes other than remedying health state, unspecified: Secondary | ICD-10-CM

## 2014-06-20 ENCOUNTER — Encounter (HOSPITAL_COMMUNITY): Payer: Self-pay | Admitting: *Deleted

## 2014-06-20 MED ORDER — DOXYCYCLINE HYCLATE 100 MG IV SOLR
100.0000 mg | Freq: Two times a day (BID) | INTRAVENOUS | Status: DC
  Administered 2014-06-21: 100 mg via INTRAVENOUS
  Filled 2014-06-20 (×4): qty 100

## 2014-06-21 ENCOUNTER — Ambulatory Visit (HOSPITAL_COMMUNITY): Payer: PRIVATE HEALTH INSURANCE

## 2014-06-21 ENCOUNTER — Encounter (HOSPITAL_COMMUNITY): Payer: Self-pay

## 2014-06-21 ENCOUNTER — Ambulatory Visit (HOSPITAL_COMMUNITY): Payer: PRIVATE HEALTH INSURANCE | Admitting: Certified Registered Nurse Anesthetist

## 2014-06-21 ENCOUNTER — Encounter (HOSPITAL_COMMUNITY)
Admission: RE | Disposition: A | Discharge status: Home / Self Care | Payer: Self-pay | Source: Ambulatory Visit | Attending: Obstetrics

## 2014-06-21 ENCOUNTER — Ambulatory Visit (HOSPITAL_COMMUNITY)
Admission: RE | Admit: 2014-06-21 | Discharge: 2014-06-21 | Disposition: A | Discharge status: Home / Self Care | Payer: PRIVATE HEALTH INSURANCE | Source: Ambulatory Visit | Attending: Obstetrics | Admitting: Obstetrics

## 2014-06-21 DIAGNOSIS — F419 Anxiety disorder, unspecified: Secondary | ICD-10-CM | POA: Insufficient documentation

## 2014-06-21 DIAGNOSIS — O359XX0 Maternal care for (suspected) fetal abnormality and damage, unspecified, not applicable or unspecified: Secondary | ICD-10-CM | POA: Diagnosis not present

## 2014-06-21 DIAGNOSIS — I10 Essential (primary) hypertension: Secondary | ICD-10-CM | POA: Diagnosis not present

## 2014-06-21 DIAGNOSIS — F329 Major depressive disorder, single episode, unspecified: Secondary | ICD-10-CM | POA: Diagnosis not present

## 2014-06-21 DIAGNOSIS — Z9049 Acquired absence of other specified parts of digestive tract: Secondary | ICD-10-CM | POA: Diagnosis not present

## 2014-06-21 DIAGNOSIS — Q913 Trisomy 18, unspecified: Secondary | ICD-10-CM | POA: Insufficient documentation

## 2014-06-21 DIAGNOSIS — O029 Abnormal product of conception, unspecified: Secondary | ICD-10-CM

## 2014-06-21 HISTORY — PX: OPERATIVE ULTRASOUND: SHX5996

## 2014-06-21 HISTORY — PX: DILATION AND EVACUATION: SHX1459

## 2014-06-21 LAB — CBC
HCT: 40.5 % (ref 36.0–46.0)
Hemoglobin: 14.2 g/dL (ref 12.0–15.0)
MCH: 30 pg (ref 26.0–34.0)
MCHC: 35.1 g/dL (ref 30.0–36.0)
MCV: 85.4 fL (ref 78.0–100.0)
PLATELETS: 170 10*3/uL (ref 150–400)
RBC: 4.74 MIL/uL (ref 3.87–5.11)
RDW: 13.2 % (ref 11.5–15.5)
WBC: 6.1 10*3/uL (ref 4.0–10.5)

## 2014-06-21 SURGERY — DILATION AND EVACUATION, UTERUS, SECOND TRIMESTER
Anesthesia: General | Site: Vagina

## 2014-06-21 MED ORDER — CHLOROPROCAINE HCL 1 % IJ SOLN
INTRAMUSCULAR | Status: DC | PRN
  Administered 2014-06-21: 20 mL

## 2014-06-21 MED ORDER — LACTATED RINGERS IV SOLN
INTRAVENOUS | Status: DC
  Administered 2014-06-21 (×2): via INTRAVENOUS

## 2014-06-21 MED ORDER — MIDAZOLAM HCL 2 MG/2ML IJ SOLN
INTRAMUSCULAR | Status: AC
  Filled 2014-06-21: qty 2

## 2014-06-21 MED ORDER — LIDOCAINE HCL (CARDIAC) 20 MG/ML IV SOLN
INTRAVENOUS | Status: AC
  Filled 2014-06-21: qty 5

## 2014-06-21 MED ORDER — PROPOFOL 10 MG/ML IV BOLUS
INTRAVENOUS | Status: AC
  Filled 2014-06-21: qty 20

## 2014-06-21 MED ORDER — SCOPOLAMINE 1 MG/3DAYS TD PT72
MEDICATED_PATCH | TRANSDERMAL | Status: AC
  Administered 2014-06-21: 1.5 mg via TRANSDERMAL
  Filled 2014-06-21: qty 1

## 2014-06-21 MED ORDER — METOCLOPRAMIDE HCL 5 MG/ML IJ SOLN: 10.0000 mg | Freq: Once | INTRAMUSCULAR | Status: DC | PRN

## 2014-06-21 MED ORDER — RHO D IMMUNE GLOBULIN 1500 UNIT/2ML IJ SOSY
300.0000 ug | PREFILLED_SYRINGE | Freq: Once | INTRAMUSCULAR | Status: AC
  Administered 2014-06-21: 300 ug via INTRAVENOUS
  Filled 2014-06-21: qty 2

## 2014-06-21 MED ORDER — SCOPOLAMINE 1 MG/3DAYS TD PT72
1.0000 | MEDICATED_PATCH | Freq: Once | TRANSDERMAL | Status: DC
  Administered 2014-06-21: 1.5 mg via TRANSDERMAL

## 2014-06-21 MED ORDER — MIDAZOLAM HCL 5 MG/5ML IJ SOLN
INTRAMUSCULAR | Status: DC | PRN
  Administered 2014-06-21: 2 mg via INTRAVENOUS

## 2014-06-21 MED ORDER — OXYCODONE-ACETAMINOPHEN 5-325 MG PO TABS: 2.0000 | ORAL_TABLET | Freq: Four times a day (QID) | ORAL | Status: DC | PRN

## 2014-06-21 MED ORDER — DEXAMETHASONE SODIUM PHOSPHATE 10 MG/ML IJ SOLN
INTRAMUSCULAR | Status: DC | PRN
  Administered 2014-06-21: 4 mg via INTRAVENOUS

## 2014-06-21 MED ORDER — LIDOCAINE HCL (CARDIAC) 20 MG/ML IV SOLN
INTRAVENOUS | Status: DC | PRN
  Administered 2014-06-21: 50 mg via INTRAVENOUS

## 2014-06-21 MED ORDER — ONDANSETRON HCL 4 MG/2ML IJ SOLN
INTRAMUSCULAR | Status: DC | PRN
  Administered 2014-06-21: 4 mg via INTRAVENOUS

## 2014-06-21 MED ORDER — KETOROLAC TROMETHAMINE 30 MG/ML IJ SOLN
30.0000 mg | Freq: Once | INTRAMUSCULAR | Status: AC
  Administered 2014-06-21: 30 mg via INTRAVENOUS

## 2014-06-21 MED ORDER — CHLOROPROCAINE HCL 1 % IJ SOLN
INTRAMUSCULAR | Status: AC
  Filled 2014-06-21: qty 30

## 2014-06-21 MED ORDER — FENTANYL CITRATE 0.05 MG/ML IJ SOLN
INTRAMUSCULAR | Status: DC | PRN
  Administered 2014-06-21: 100 ug via INTRAVENOUS

## 2014-06-21 MED ORDER — FENTANYL CITRATE 0.05 MG/ML IJ SOLN
INTRAMUSCULAR | Status: AC
  Filled 2014-06-21: qty 2

## 2014-06-21 MED ORDER — RHO D IMMUNE GLOBULIN 1500 UNIT/2ML IJ SOSY
300.0000 ug | PREFILLED_SYRINGE | Freq: Once | INTRAMUSCULAR | Status: DC
  Filled 2014-06-21: qty 2

## 2014-06-21 MED ORDER — KETOROLAC TROMETHAMINE 30 MG/ML IJ SOLN
INTRAMUSCULAR | Status: AC
  Administered 2014-06-21: 30 mg via INTRAVENOUS
  Filled 2014-06-21: qty 1

## 2014-06-21 MED ORDER — FENTANYL CITRATE 0.05 MG/ML IJ SOLN
25.0000 ug | INTRAMUSCULAR | Status: DC | PRN
  Administered 2014-06-21: 50 ug via INTRAVENOUS

## 2014-06-21 MED ORDER — PROPOFOL 10 MG/ML IV BOLUS
INTRAVENOUS | Status: DC | PRN
  Administered 2014-06-21: 200 mg via INTRAVENOUS

## 2014-06-21 SURGICAL SUPPLY — 18 items
ADAPTER VACURETTE TBG SET 14 (CANNULA) ×3 IMPLANT
CLOTH BEACON ORANGE TIMEOUT ST (SAFETY) ×3 IMPLANT
CONT PATH 16OZ SNAP LID 3702 (MISCELLANEOUS) ×3 IMPLANT
CONTAINER PREFILL 10% NBF 60ML (FORM) IMPLANT
GLOVE BIO SURGEON STRL SZ 6.5 (GLOVE) ×3 IMPLANT
GLOVE BIOGEL PI IND STRL 7.0 (GLOVE) ×2 IMPLANT
GLOVE BIOGEL PI INDICATOR 7.0 (GLOVE) ×1
GOWN STRL REUS W/TWL LRG LVL3 (GOWN DISPOSABLE) ×6 IMPLANT
KIT BERKELEY 2ND TRIMESTER 1/2 (COLLECTOR) ×3 IMPLANT
NS IRRIG 1000ML POUR BTL (IV SOLUTION) ×3 IMPLANT
PACK VAGINAL MINOR WOMEN LF (CUSTOM PROCEDURE TRAY) ×3 IMPLANT
PAD OB MATERNITY 4.3X12.25 (PERSONAL CARE ITEMS) ×3 IMPLANT
PAD PREP 24X48 CUFFED NSTRL (MISCELLANEOUS) ×3 IMPLANT
TOWEL OR 17X24 6PK STRL BLUE (TOWEL DISPOSABLE) ×6 IMPLANT
TUBE VACURETTE 2ND TRIMESTER (CANNULA) ×3 IMPLANT
VACURETTE 12 RIGID CVD (CANNULA) ×1 IMPLANT
VACURETTE 14MM CVD 1/2 BASE (CANNULA) IMPLANT
VACURETTE 16MM ASPIR CVD .5 (CANNULA) IMPLANT

## 2014-06-21 NOTE — Anesthesia Procedure Notes (Signed)
Procedure Name: LMA Insertion Date/Time: 06/21/2014 10:08 AM Performed by: Marrion CoyMERRITT, Diana Speyer R Pre-anesthesia Checklist: Patient identified, Suction available, Patient being monitored, Timeout performed and Emergency Drugs available Patient Re-evaluated:Patient Re-evaluated prior to inductionOxygen Delivery Method: Circle system utilized Preoxygenation: Pre-oxygenation with 100% oxygen Intubation Type: IV induction Ventilation: Mask ventilation without difficulty LMA: LMA inserted LMA Size: 4.0 Number of attempts: 1 Placement Confirmation: positive ETCO2 and breath sounds checked- equal and bilateral Tube secured with: Tape

## 2014-06-21 NOTE — Anesthesia Preprocedure Evaluation (Signed)
Anesthesia Evaluation  Patient identified by MRN, date of birth, ID band Patient awake    Reviewed: Allergy & Precautions, NPO status , Patient's Chart, lab work & pertinent test results  Airway Mallampati: III  TM Distance: >3 FB Neck ROM: Full    Dental no notable dental hx. (+) Teeth Intact   Pulmonary neg pulmonary ROS,  breath sounds clear to auscultation  Pulmonary exam normal       Cardiovascular hypertension, Pt. on medications Rhythm:Regular Rate:Normal     Neuro/Psych  Headaches, PSYCHIATRIC DISORDERS Anxiety Depression Benign intracranial hypertension    GI/Hepatic negative GI ROS, Neg liver ROS,   Endo/Other  Morbid obesityHypertriglyceridemia  Renal/GU negative Renal ROS  negative genitourinary   Musculoskeletal negative musculoskeletal ROS (+)   Abdominal (+) + obese,   Peds  Hematology negative hematology ROS (+)   Anesthesia Other Findings   Reproductive/Obstetrics (+) Pregnancy Fetal anomalies-omphlocele, trisomy 18 16 weeks                              Anesthesia Physical Anesthesia Plan  ASA: III  Anesthesia Plan: General   Post-op Pain Management:    Induction: Intravenous  Airway Management Planned: LMA  Additional Equipment:   Intra-op Plan:   Post-operative Plan: Extubation in OR  Informed Consent: I have reviewed the patients History and Physical, chart, labs and discussed the procedure including the risks, benefits and alternatives for the proposed anesthesia with the patient or authorized representative who has indicated his/her understanding and acceptance.   Dental advisory given  Plan Discussed with: CRNA, Anesthesiologist and Surgeon  Anesthesia Plan Comments:         Anesthesia Quick Evaluation

## 2014-06-21 NOTE — Brief Op Note (Signed)
06/21/2014  10:55 AM  PATIENT:  Darius Bumpabitha Koy  34 y.o. 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  POST-OPERATIVE DIAGNOSIS:  Trisomy 518 with multiple fetal anomalies  PROCEDURE:  Procedure(s): DILATATION AND EVACUATION (D&E) 2ND TRIMESTER (N/A) OPERATIVE ULTRASOUND (N/A)  SURGEON:  Surgeon(s) and Role:    * Noland FordyceKelly Tannie Koskela, MD - Primary  PHYSICIAN ASSISTANT:   ASSISTANTS: none   ANESTHESIA:   local and general  EBL:  Total I/O In: 800 [I.V.:800] Out: -   BLOOD ADMINISTERED:none  DRAINS: none   LOCAL MEDICATIONS USED:  OTHER 20 cc of 1% Nesacaine  SPECIMEN:  Source of Specimen:  Products of conception  DISPOSITION OF SPECIMEN:  PATHOLOGY  COUNTS:  YES  TOURNIQUET:  * No tourniquets in log *  DICTATION: .Dragon Dictation  PLAN OF CARE: Discharge to home after PACU  PATIENT DISPOSITION:  PACU - hemodynamically stable.   Delay start of Pharmacological VTE agent (>24hrs) due to surgical blood loss or risk of bleeding: yes

## 2014-06-21 NOTE — Discharge Instructions (Signed)
DISCHARGE INSTRUCTIONS: D&E The following instructions have been prepared to help you care for yourself upon your return home.  MAY TAKE IBUPROFEN (MOTRIN, ADVIL) OR ALEVE AFTER 3:45 PM FOR PAIN. MAY TAKE THE PATCH YOUR EAR OFF IN 48 HOURS.   Personal hygiene:  Use sanitary pads for vaginal drainage, not tampons.  Shower the day after your procedure.  NO tub baths, pools or Jacuzzis for 2-3 weeks.  Wipe front to back after using the bathroom.  Activity and limitations:  Do NOT drive or operate any equipment for 24 hours. The effects of anesthesia are still present and drowsiness may result.  Do NOT rest in bed all day.  Walking is encouraged.  Walk up and down stairs slowly.  You may resume your normal activity in one to two days or as indicated by your physician.  Sexual activity: NO intercourse for at least 2 weeks after the procedure, or as indicated by your physician.  Diet: Eat a light meal as desired this evening. You may resume your usual diet tomorrow.  Return to work: You may resume your work activities in one to two days or as indicated by your doctor.  What to expect after your surgery: Expect to have vaginal bleeding/discharge for 2-3 days and spotting for up to 10 days. It is not unusual to have soreness for up to 1-2 weeks. You may have a slight burning sensation when you urinate for the first day. Mild cramps may continue for a couple of days. You may have a regular period in 2-6 weeks.  Call your doctor for any of the following:  Excessive vaginal bleeding, saturating and changing one pad every hour.  Inability to urinate 6 hours after discharge from hospital.  Pain not relieved by pain medication.  Fever of 100.4 F or greater.  Unusual vaginal discharge or odor.   Call for an appointment:    Patients signature: ______________________  Nurses signature ________________________  Support person's signature_______________________

## 2014-06-21 NOTE — H&P (Signed)
CC: for u/s guided D&E due to Trisomy 8218 and fetal abnormalities  HPI: 34 yo G1 at 16'6 with h/o infertility and then spontaneous pregnancy which has been complicated by cystic hygroma, shortened long bones, omphalocele and eventual diagnosis of trisomy 4218 after amniocentesis. Options d/w pt and she has decided for D&E.  Pt presents today after taking buccal cytotec at 6am. Notes some uterine cramping, mild, w/o bleeding. Pt grieving appropriately but no other complaints.   Past Medical History  Diagnosis Date  . Depression   . Hypertension    pseudotumor cerebri Arcuate uterus Anxiety  Past Surgical History  Procedure Laterality Date  . Cholecystectomy      Meds: Prozac, [place] All: NKDA  CBC    Component Value Date/Time   WBC 6.1 06/21/2014 0920   RBC 4.74 06/21/2014 0920   HGB 14.2 06/21/2014 0920   HCT 40.5 06/21/2014 0920   PLT 170 06/21/2014 0920   MCV 85.4 06/21/2014 0920   MCH 30.0 06/21/2014 0920   MCHC 35.1 06/21/2014 0920   RDW 13.2 06/21/2014 0920   LYMPHSABS 1.7 04/25/2009 0842   MONOABS 0.4 04/25/2009 0842   EOSABS 0.1 04/25/2009 0842   BASOSABS 0.0 04/25/2009 0842     PE:  Filed Vitals:   06/21/14 0921  BP: 147/82  Pulse: 81  Temp: 98.6 F (37 C)  TempSrc: Oral  Resp: 22  Height: 5\' 4"  (1.626 m)  SpO2: 99%   Gen: no distress Abd: obese, NT Gu: def to OR LE: NT, no edema  A/P: u/s guided D&E Pt aware risks/ benefits.   Jakylah Bassinger A. 06/21/2014 9:52 AM

## 2014-06-21 NOTE — Op Note (Signed)
06/21/2014  10:55 AM  PATIENT:  [name]  34 y.o. female  PRE-OPERATIVE DIAGNOSIS:  Trisomy 78 with multiple fetal anomalies  POST-OPERATIVE DIAGNOSIS:  Trisomy 77 with multiple fetal anomalies  PROCEDURE:  Procedure(s): DILATATION AND EVACUATION (D&E) 2ND TRIMESTER (N/A) OPERATIVE ULTRASOUND (N/A)  SURGEON:  Surgeon(s) and Role:    * Noland Fordyce, MD - Primary  PHYSICIAN ASSISTANT:   ASSISTANTS: none   ANESTHESIA:   local and general  EBL:  Total I/O In: 800 [I.V.:800] Out: -   BLOOD ADMINISTERED:none  DRAINS: none   LOCAL MEDICATIONS USED:  OTHER 20 cc of 1% Nesacaine  SPECIMEN:  Source of Specimen:  Products of conception  DISPOSITION OF SPECIMEN:  PATHOLOGY  COUNTS:  YES  TOURNIQUET:  * No tourniquets in log *  DICTATION: .Dragon Dictation  PLAN OF CARE: Discharge to home after PACU  PATIENT DISPOSITION:  PACU - hemodynamically stable.   Delay start of Pharmacological VTE agent (>24hrs) due to surgical blood loss or risk of bleeding: yes  Antibiotic: 100 mg of IV doxycycline Findings: 16 weeks size uterus preprocedure, decreased uterine size post procedure with adequate hemostasis. thin endometrial stripe at  0.4cm with no flow suggestive retained products of conception Complications: None Indications: A 34 G1 who presented for dilation and evacuation given fetal anomalies. Patient was given options of expectant management until natural fetal demise vs  cytotec versus surgical evacuation and chose the latter. Risks benefits were discussed with patient who agreed to proceed.   Procedure: After informed was obtained the patient was taken to the operating room where general anesthesia was initiated without difficulty. She was prepped and draped in the normal fashion in dorsal supine lithotomy position.  No bladder drainage was done due  to the desire for a full bladder for improved ultrasound guidance. A bimanual examination was performed to assess the  size and the position of the uterus. A sterile speculum was placed into the vagina. A single-tooth tenaculum was used to grasp the anterior lip of the cervix. 10 cc of 1% Nesacaine was infused in the cervical paracervical junction at 5 and 7:00. The cervix was serially dilated with Shawnie Pons dilators to a #49 under ultrasound guidance. A 16 French suction curet was advanced past the internal os under ultrasound guidance. Suction curettage was carried out. After the first pass it was felt the calvarium would not pass through the 16 [place]. Under ultrasound guidance a large grasping clamp was guided towards the fetal calvarium which was decompressed. An additional pass with the 16 French suction curette was able to remove additional POC's and pull the fetal calvarium and spine into the cervix. This was removed with a grasping forcep. Ultrasound was used to evaluate both sides of the arcuate uterus. A small fluid collection with possible POC's was noted in the right aspect. A sharp gentle curettage was done to remove a small amount of tissue. At this point the endometrial stripe is thin no active blood flow was noted and no evidence of continued retained POC's.   Hemostasis was noted and the decision was made to end the procedure. Ultrasound was used to confirm a thin endometrial stripe and no blood flow. The tenaculum was removed. Pressure and silver nitrate were used to control the tenaculum site. Repeat bimanual examination was done to confirm good uterine tone and good hemostasis. The procedure was then terminated. Patient tolerated the procedure well sponge lap and needle count were correct x3. Patient seems recovery room in stable condition.  Deloros Beretta A. 01/23/2012 10:53 AM

## 2014-06-21 NOTE — Anesthesia Postprocedure Evaluation (Signed)
Anesthesia Post Note  Patient: [name]  Procedure(s) Performed: Procedure(s) (LRB): DILATATION AND EVACUATION (D&E) 2ND TRIMESTER (N/A) OPERATIVE ULTRASOUND (N/A)  Anesthesia type: General  Patient location: PACU  Post pain: Pain level controlled  Post assessment: Post-op Vital signs reviewed  Last Vitals:  Filed Vitals:   06/21/14 1115  BP: 128/54  Pulse: 72  Temp:   Resp: 19    Post vital signs: Reviewed  Level of consciousness: sedated  Complications: No apparent anesthesia complications

## 2014-06-21 NOTE — Transfer of Care (Signed)
Immediate Anesthesia Transfer of Care Note  Patient: [name]  Procedure(s) Performed: Procedure(s): DILATATION AND EVACUATION (D&E) 2ND TRIMESTER (N/A) OPERATIVE ULTRASOUND (N/A)  Patient Location: PACU  Anesthesia Type:General  Level of Consciousness: awake, alert , oriented and patient cooperative  Airway & Oxygen Therapy: Patient Spontanous Breathing and Patient connected to nasal cannula oxygen  Post-op Assessment: Report given to RN, Post -op Vital signs reviewed and stable and Patient moving all extremities X 4  Post vital signs: Reviewed and stable  Last Vitals:  Filed Vitals:   06/21/14 0921  BP: 147/82  Pulse: 81  Temp: 37 C  Resp: 22    Complications: No apparent anesthesia complications

## 2014-06-22 ENCOUNTER — Encounter (HOSPITAL_COMMUNITY): Payer: Self-pay | Admitting: Obstetrics

## 2014-06-22 LAB — RH IG WORKUP (INCLUDES ABO/RH)
ABO/RH(D): O NEG
Gestational Age(Wks): 17
Unit division: 0

## 2014-06-23 LAB — TYPE AND SCREEN
ABO/RH(D): O NEG
ANTIBODY SCREEN: POSITIVE
DAT, IGG: NEGATIVE
UNIT DIVISION: 0
Unit division: 0
Unit division: 0
Unit division: 0

## 2014-06-25 ENCOUNTER — Encounter (HOSPITAL_COMMUNITY): Payer: Self-pay | Admitting: MS"

## 2014-06-25 ENCOUNTER — Telehealth (HOSPITAL_COMMUNITY): Payer: Self-pay | Admitting: MS"

## 2014-06-25 NOTE — Telephone Encounter (Signed)
Called Ms. [name] regarding final karyotype analysis results of amniocentesis. 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 Thus, parental chromosome analysis is not indicated for the couple at this time. Reviewed that recurrence risk for any trisomy in a future pregnancy is approximately 1%. This assessment changes with maternal age. Discussed that patient would have all screening and testing options available in a future pregnancy to assess for fetal aneuploidy, if desired.   Patient had D&E last week. Asked patient how she is coping. She stated that she is doing OK. She stated that she does relatively well during the day as long as she is able to keep busy but that it is more difficult at night when things calm down. Reviewed available support resources with her, including [employer] and bereavement counselor. Asked patient to contact me if she is interested and would like help being connected with a bereavement counselor.   All questions were answered at this time. Encouraged patient to call back if additional questions or concerns arise.   Clydie BraunKaren Raiden Haydu 06/25/2014  9:29 AM

## 2014-07-02 ENCOUNTER — Other Ambulatory Visit (HOSPITAL_COMMUNITY): Payer: Self-pay | Admitting: Obstetrics

## 2014-07-04 ENCOUNTER — Ambulatory Visit (HOSPITAL_COMMUNITY): Admission: RE | Admit: 2014-07-04 | Payer: PRIVATE HEALTH INSURANCE | Source: Ambulatory Visit

## 2016-04-16 LAB — OB RESULTS CONSOLE ABO/RH: RH Type: NEGATIVE

## 2016-04-16 LAB — OB RESULTS CONSOLE GC/CHLAMYDIA
CHLAMYDIA, DNA PROBE: NEGATIVE
GC PROBE AMP, GENITAL: NEGATIVE

## 2016-04-16 LAB — OB RESULTS CONSOLE RUBELLA ANTIBODY, IGM: RUBELLA: IMMUNE

## 2016-04-16 LAB — OB RESULTS CONSOLE RPR: RPR: NONREACTIVE

## 2016-04-16 LAB — OB RESULTS CONSOLE ANTIBODY SCREEN: ANTIBODY SCREEN: NEGATIVE

## 2016-04-16 LAB — OB RESULTS CONSOLE HEPATITIS B SURFACE ANTIGEN: HEP B S AG: NEGATIVE

## 2016-04-16 LAB — OB RESULTS CONSOLE HIV ANTIBODY (ROUTINE TESTING): HIV: NONREACTIVE

## 2016-07-16 IMAGING — US US AMNIOCENTESIS
1 series · 12 of 16 positions shown · non-contrast
Comparison: none

[Series 1: us amniocentesis · 12 of 62 slices shown]
[im 1/62]
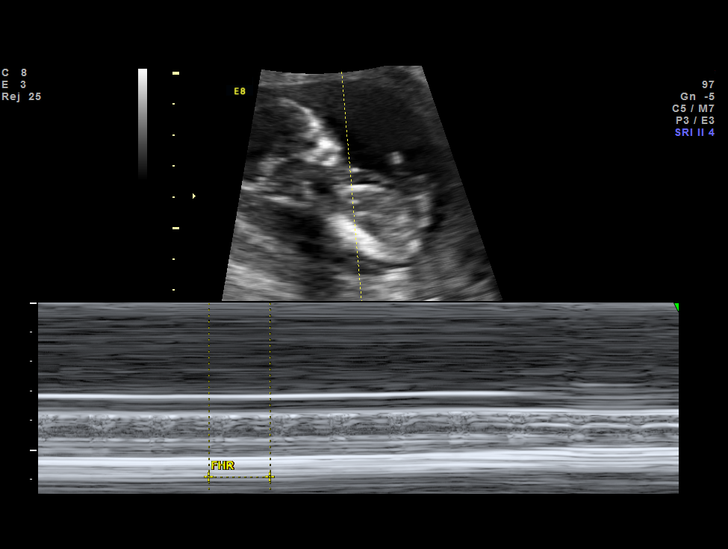
[im 9/62]
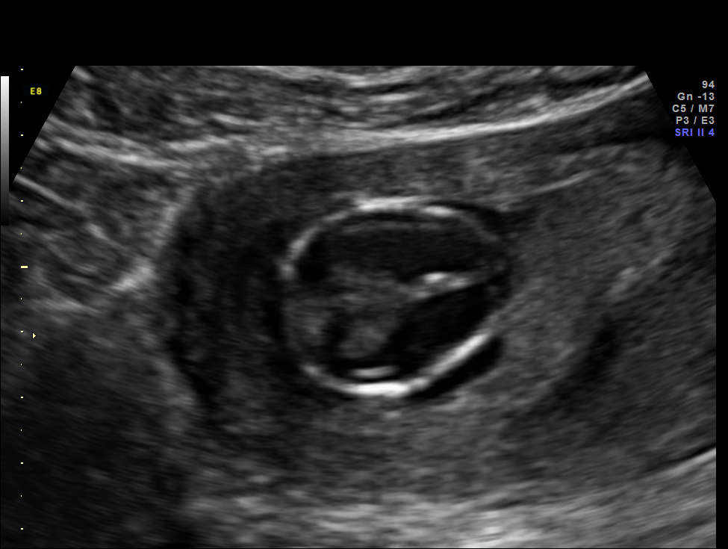
[im 13/62]
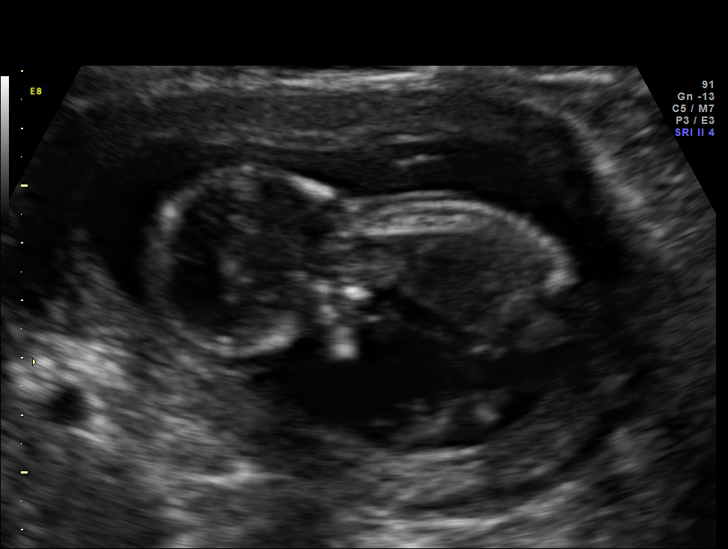
[im 17/62]
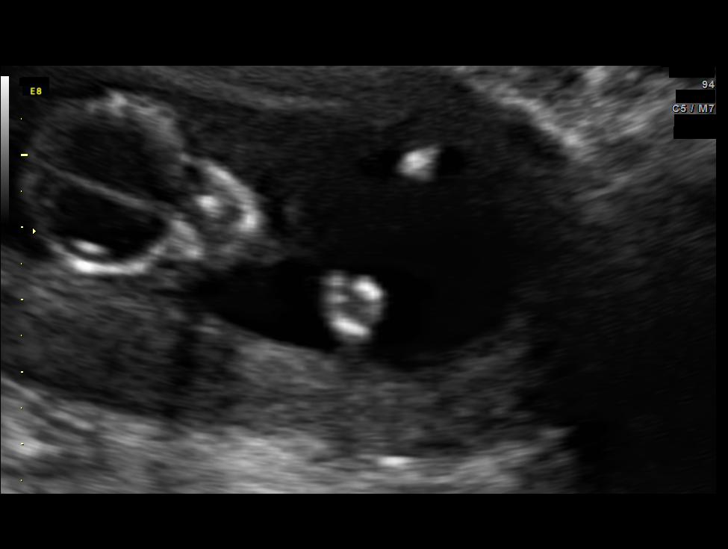
[im 25/62]
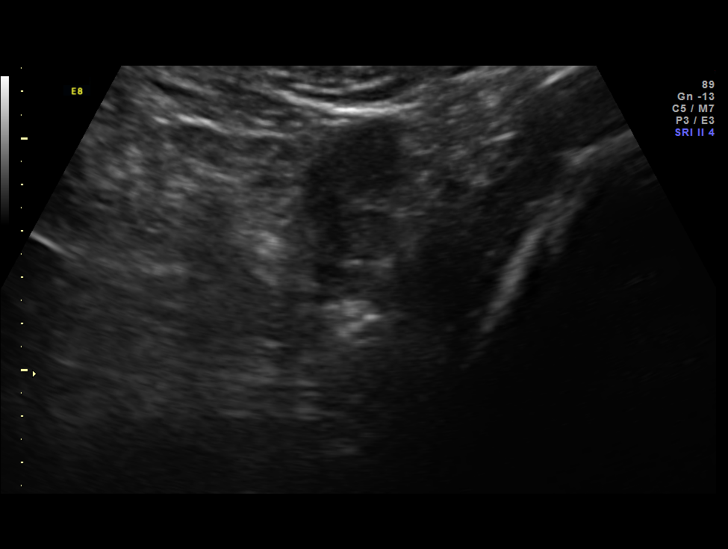
[im 29/62]
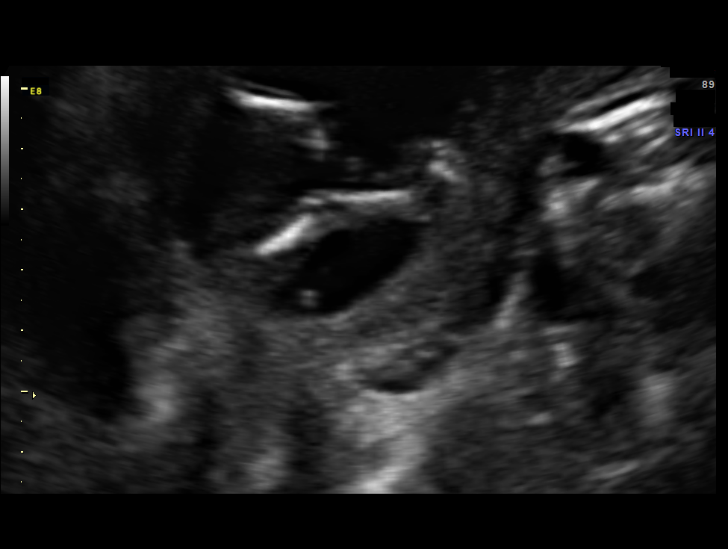
[im 33/62]
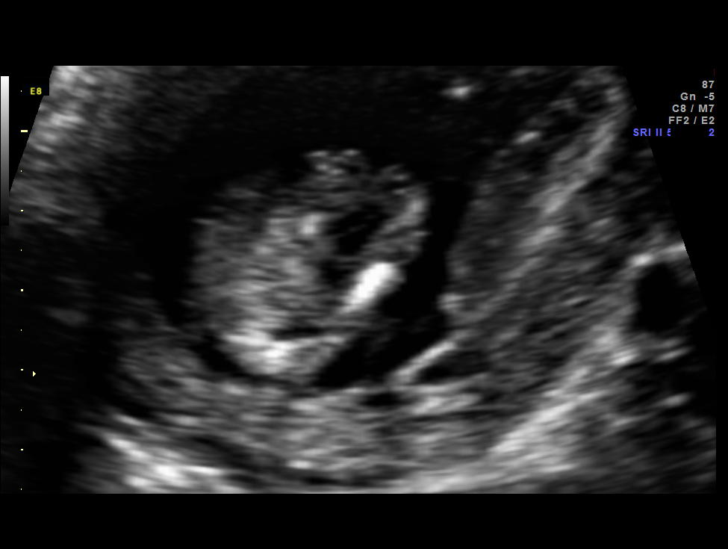
[im 41/62]
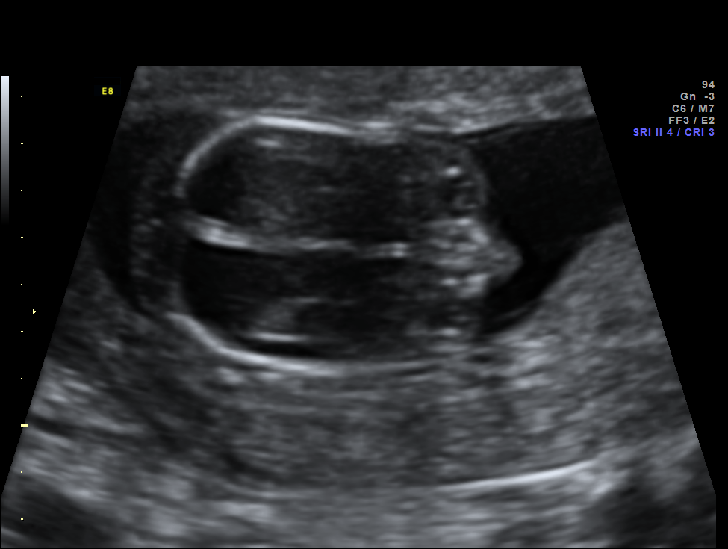
[im 45/62]
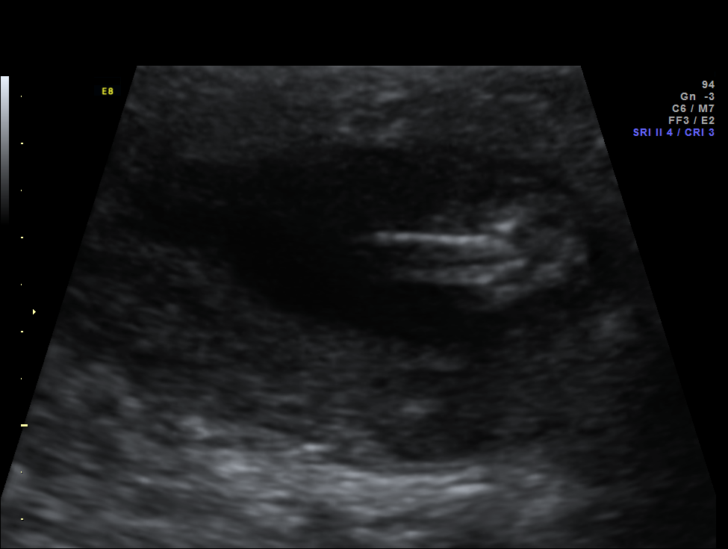
[im 49/62]
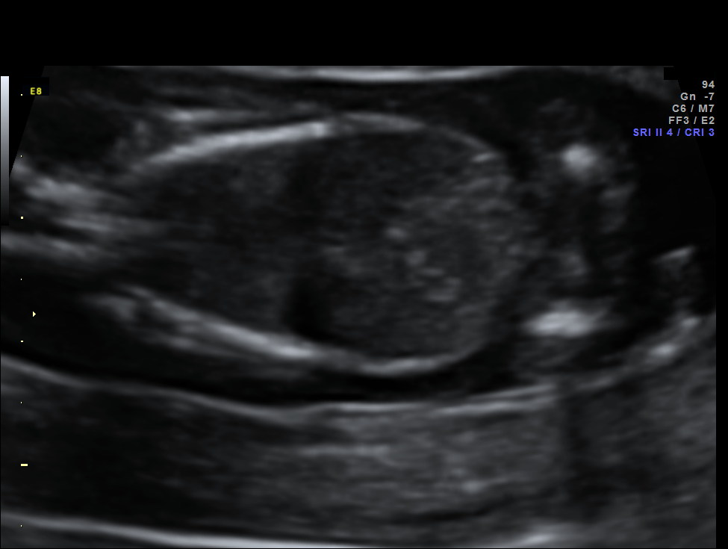
[im 57/62]
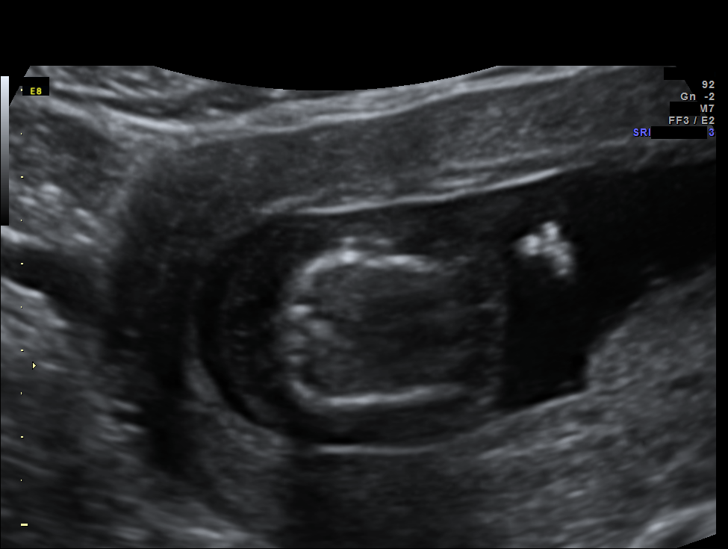
[im 62/62]
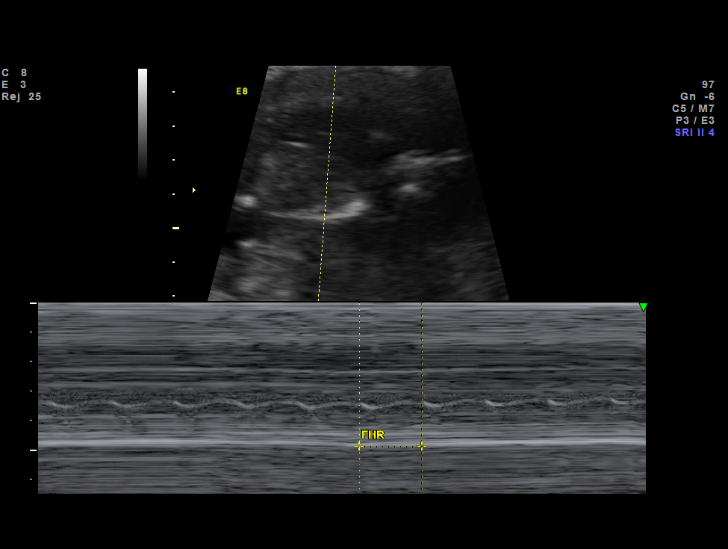

[12 of 16 positions shown; findings below may reference images not displayed]

OBSTETRICS REPORT
                      (Signed Final 06/12/2014 [DATE])

Service(s) Provided

 US AMNIOCENTESIS                                      76946.0
Indications

 Low risk informaSeq
 Cystic hygroma
 15 weeks gestation of pregnancy
 Abnormal ultrasounic finding on antenatal
 screening of mother
 Maternal care for other (suspected) fetal
 abnormality and damage, not applicable or
 unspecified
Fetal Evaluation

 Num Of Fetuses:    1
 Fetal Heart Rate:  165                          bpm
 Cardiac Activity:  Observed
 Presentation:      Breech
 Placenta:          Posterior, above cervical
                    os
 P. Cord            Visualized
 Insertion:

 Amniotic Fluid
 AFI FV:      Subjectively within normal limits
Gestational Age

 LMP:           15w 4d        Date:  02/23/14                 EDD:   11/30/14
 Best:          15w 4d     Det. By:  LMP  (02/23/14)          EDD:   11/30/14
Anatomy

 Cranium:          Appears normal         Nuchal Fold:      Cystic hygroma
 Fetal Cavum:      Abnormal, see          Face:             Micrognathia
                   comments
 Ventricles:       Abnormal, see          Cord Vessels:     Appears normal (3
                   comments
                                                            vessel cord)
 Choroid Plexus:   Appears normal         Upper             Abnormal, see
                                          Extremities:
                                                            comments
Guided Procedures
 Type:   Amniocentesis for fetal genetic study

 FH Post Procedure:     Normal             RH Type:          O-
 Rh Immune Globulin:    RhoGam given,      Discharge Inst.:  Post-procedure
                        Rh negative                          instructions
                                                             given
 Needle Insertions:     22 gauge x 1       Vol. Withdrawn:   20 ml of clear
                                                             amniotic fluid
Cervix Uterus Adnexa

 Cervical Length:    3.6      cm

 Cervix:       Normal appearance by transabdominal scan. Appears
               closed, without funnelling.

 Left Ovary:    Within normal limits.
 Right Ovary:   Not visualized. No adnexal mass visualized.
Impression

 SIUP at 15+4 weeks
 Cystic hygroma, including bilateral nuchal hygromas;
 generalized skin edema; ventriculomegaly; bilateral pleural
 effusions - i.e. hydrops; (? absent CSP and abnormal
 hand/finger positioning)
 Normal amniotic fluid volume

 Amniocentesis performed without immediate complication;
 blood type O negative - Mg(JIM) immune globulin given; AF
 sent for FISH, karyotype and will reflex to microarray.

 The US findings were shared with Ms. Ecker and her
 husband. Unfortunately, in the setting of a hygroma +
 hydrops, the prognosis is very poor. Fetal demise usually
 occurs within days to weeks. They will await the results
 before making any further decisions.
Recommendations

 Targeted anatomic survey in 
 3 weeks

## 2016-09-21 ENCOUNTER — Telehealth (HOSPITAL_COMMUNITY): Payer: Self-pay | Admitting: *Deleted

## 2016-09-21 NOTE — Telephone Encounter (Signed)
Preadmission screen  

## 2016-09-24 ENCOUNTER — Other Ambulatory Visit: Payer: Self-pay | Admitting: Obstetrics

## 2016-10-01 ENCOUNTER — Encounter (HOSPITAL_COMMUNITY)
Admission: RE | Admit: 2016-10-01 | Discharge: 2016-10-01 | Disposition: A | Discharge status: Home / Self Care | Payer: PRIVATE HEALTH INSURANCE | Source: Ambulatory Visit | Attending: Obstetrics | Admitting: Obstetrics

## 2016-10-01 ENCOUNTER — Encounter (HOSPITAL_COMMUNITY): Payer: Self-pay | Admitting: *Deleted

## 2016-10-01 HISTORY — DX: Headache: R51

## 2016-10-01 HISTORY — DX: Benign intracranial hypertension: G93.2

## 2016-10-01 HISTORY — DX: Headache, unspecified: R51.9

## 2016-10-01 HISTORY — DX: Polycystic ovarian syndrome: E28.2

## 2016-10-01 HISTORY — DX: Anemia, unspecified: D64.9

## 2016-10-01 HISTORY — DX: Personal history of other diseases of the female genital tract: Z87.42

## 2016-10-01 HISTORY — DX: Gastro-esophageal reflux disease without esophagitis: K21.9

## 2016-10-01 NOTE — Pre-Procedure Instructions (Signed)
Arrived for anesthesia consult.  No complaints at present.  Pt placed in Short Stay where labs are drawn.  Anesthesia notified or pt arrival.

## 2016-10-09 ENCOUNTER — Encounter (HOSPITAL_COMMUNITY): Payer: Self-pay

## 2016-10-22 NOTE — Patient Instructions (Signed)
20 [name]  10/22/2016   Your procedure is scheduled on:  10/26/2016  Enter through the [facility] Entrance of [facility] at 1100 AM.  Pick up the phone at the desk and dial [phone].   Call this number if you have problems the morning of surgery: [phone]   Remember:   Do not eat food:After Midnight.  Do not drink clear liquids: After Midnight.  Take these medicines the morning of surgery with A SIP OF WATER: Please take your labetalol.  You may take your zantac, buspar and prozac if you usually take them in the morning.   Do not wear jewelry, make-up or nail polish.  Do not wear lotions, powders, or perfumes. Do not wear deodorant.  Do not shave 48 hours prior to surgery.  Do not bring valuables to the hospital.  [facility] is not   responsible for any belongings or valuables brought to the hospital.  Contacts, dentures or bridgework may not be worn into surgery.  Leave suitcase in the car. After surgery it may be brought to your room.  For patients admitted to the hospital, checkout time is 11:00 AM the day of              discharge.   Patients discharged the day of surgery will not be allowed to drive             home.  Name and phone number of your driver: na  Special Instructions:   N/A   Please read over the following fact sheets that you were given:   Surgical Site Infection Prevention

## 2016-10-23 ENCOUNTER — Encounter (HOSPITAL_COMMUNITY): Payer: PRIVATE HEALTH INSURANCE

## 2016-10-23 ENCOUNTER — Encounter (HOSPITAL_COMMUNITY)
Admission: RE | Admit: 2016-10-23 | Discharge: 2016-10-23 | Disposition: A | Discharge status: Home / Self Care | Payer: PRIVATE HEALTH INSURANCE | Source: Ambulatory Visit | Attending: Obstetrics | Admitting: Obstetrics

## 2016-10-23 HISTORY — DX: Maternal care for other (suspected) fetal abnormality and damage, not applicable or unspecified: O35.8XX0

## 2016-10-23 HISTORY — DX: Cardiac murmur, unspecified: R01.1

## 2016-10-23 HISTORY — DX: Anxiety disorder, unspecified: F41.9

## 2016-10-23 LAB — COMPREHENSIVE METABOLIC PANEL
ALK PHOS: 136 U/L — AB (ref 38–126)
ALT: 9 U/L — ABNORMAL LOW (ref 14–54)
ANION GAP: 10 (ref 5–15)
AST: 20 U/L (ref 15–41)
Albumin: 3.3 g/dL — ABNORMAL LOW (ref 3.5–5.0)
BUN: 6 mg/dL (ref 6–20)
CALCIUM: 9.5 mg/dL (ref 8.9–10.3)
CO2: 23 mmol/L (ref 22–32)
CREATININE: 0.59 mg/dL (ref 0.44–1.00)
Chloride: 104 mmol/L (ref 101–111)
Glucose, Bld: 99 mg/dL (ref 65–99)
Potassium: 4.1 mmol/L (ref 3.5–5.1)
Sodium: 137 mmol/L (ref 135–145)
Total Bilirubin: 0.7 mg/dL (ref 0.3–1.2)
Total Protein: 7.1 g/dL (ref 6.5–8.1)

## 2016-10-23 LAB — URINALYSIS, ROUTINE W REFLEX MICROSCOPIC
BILIRUBIN URINE: NEGATIVE
Glucose, UA: NEGATIVE mg/dL
Hgb urine dipstick: NEGATIVE
KETONES UR: NEGATIVE mg/dL
Nitrite: NEGATIVE
PROTEIN: NEGATIVE mg/dL
Specific Gravity, Urine: 1.014 (ref 1.005–1.030)
pH: 5 (ref 5.0–8.0)

## 2016-10-23 LAB — CBC
HCT: 37.2 % (ref 36.0–46.0)
Hemoglobin: 12.2 g/dL (ref 12.0–15.0)
MCH: 28.2 pg (ref 26.0–34.0)
MCHC: 32.8 g/dL (ref 30.0–36.0)
MCV: 86.1 fL (ref 78.0–100.0)
PLATELETS: 190 10*3/uL (ref 150–400)
RBC: 4.32 MIL/uL (ref 3.87–5.11)
RDW: 14.6 % (ref 11.5–15.5)
WBC: 6.5 10*3/uL (ref 4.0–10.5)

## 2016-10-24 LAB — RPR: RPR Ser Ql: NONREACTIVE

## 2016-10-25 NOTE — H&P (Addendum)
[name] is a 36 y.o. G2P0010 at [redacted]w[redacted]d presenting for PCS due to pseudotumor cerebri. Pt notes intermittent contractions. Good fetal movement, No vaginal bleeding, not leaking fluid.   [facility] at [facility] since 6 wks - pseudotumor cerebri. Pt with significant symptoms from this during pregnancy. Has been following with neurology [name]) and getting serial spinal taps to relieve high pressure due to HA and vision change. Pt did have spinal leak once during pregnancy and needed blood patch.  Neurology recommends PCS to avoid valsalva with delivery. MFM consulted and pt was given options for SVD. Pt prefers PCS due to risks and plan for small family. Pt has had consult with anasthesia who plans spinal anasthesia - Chronic htn. Not on baby ASA due to freq spinal taps. O,n labetalol, overall well controlled and no evidence PEC - h/o baby with T-18. 15 wk demise with D&C. Pt with nl Panorama this preg. - O neg, weak anti-D present throughout preg, too weak to titer and has been checked monthly - h/o gastric sleeve/ Bariatric surgery. Unable to tolerated GTT . Checking home bp's about monthly, nl results, no evidence GDM - Anxiety. On Prozac and Buspar, follows with therapist. Pt concerned about PP exacertbation, will plan SW consult - GERD, on meds - Clomid preg, desired. Dated by 6/7 wk u/s, late ovulation - carpal tunnel.    Prenatal Transfer Tool  Maternal Diabetes: No Genetic Screening: Normal Maternal Ultrasounds/Referrals: Normal Fetal Ultrasounds or other Referrals:  Referred to Materal Fetal Medicine  Maternal Substance Abuse:  No Significant Maternal Medications:  None Significant Maternal Lab Results: None     OB History    Gravida Para Term Preterm AB Living   2       1     SAB TAB Ectopic Multiple Live Births     1           Past Medical History:  Diagnosis Date  . Anemia   . Anxiety   . Cystic hygroma of fetus in singleton pregnancy   . Depression    . GERD (gastroesophageal reflux disease)   . H/O infertility   . Headache   . Heart murmur   . Hypertension   . PCOS (polycystic ovarian syndrome)   . Pseudotumor cerebri    Past Surgical History:  Procedure Laterality Date  . CHOLECYSTECTOMY    . DILATION AND EVACUATION N/A 06/21/2014   Procedure: DILATATION AND EVACUATION (D&E) 2ND TRIMESTER;  Surgeon: Noland Fordyce, MD;  Location: [facility];  Service: Gynecology;  Laterality: N/A;  . GASTRECTOMY    . OPERATIVE ULTRASOUND N/A 06/21/2014   Procedure: OPERATIVE ULTRASOUND;  Surgeon: Noland Fordyce, MD;  Location: [facility];  Service: Gynecology;  Laterality: N/A;   Family History: family history includes Breast cancer in her paternal aunt; Cervical cancer in her mother; Colon cancer in her maternal grandfather and paternal grandfather; Diabetes in her father and paternal grandfather; Heart disease in her father, maternal grandfather, paternal grandfather, and paternal grandmother. Social History:  reports that she has never smoked. She has never used smokeless tobacco. She reports that she does not drink alcohol or use drugs.  Review of Systems - Negative except HA, discomfort of pregnancy     unknown if currently breastfeeding.  Physical Exam:  Prenatal labs: ABO, Rh: --/--/O NEG (08/17 1430) Antibody: POS (08/17 1430) Rubella: !Error! RPR: Non Reactive (08/17 1438)  HBsAg: Negative (02/08 0000)  HIV: Non-reactive (02/08 0000)  GBS:   positive 1 hr Glucola  normal qid BS checks  Genetic screening nl Panorama Anatomy US normal   Assessment/Plan: 36 y.o. G2P0010 at [redacted]w[redacted]d PCS for pseudotumor cerebri, agree with neurology reccs and pt declines option of IOL with assisted delivery. OK for spinal anasthesia per anasthesiology consult. Chronic htn, continued HA, plan delivery 38-39 wks per MFM consult.  - GBS pos - SW consult - chronic htn   Diana Lemma A. 10/25/2016, 10:29 PM   Pt seen prior to surgery. No changes to  above  Vitals:   10/26/16 1102  BP: (!) 142/88  Pulse: 89  Resp: 18  Temp: 98.3 F (36.8 C)  TempSrc: Oral  Weight: 110.2 kg (243 lb)  Height: 5\' 4"  (1.626 m)     CBC    Component Value Date/Time   WBC 6.5 10/23/2016 1438   RBC 4.32 10/23/2016 1438   HGB 12.2 10/23/2016 1438   HCT 37.2 10/23/2016 1438   PLT 190 10/23/2016 1438   MCV 86.1 10/23/2016 1438   MCH 28.2 10/23/2016 1438   MCHC 32.8 10/23/2016 1438   RDW 14.6 10/23/2016 1438   LYMPHSABS 1.7 04/25/2009 0842   MONOABS 0.4 04/25/2009 0842   EOSABS 0.1 04/25/2009 0842   BASOSABS 0.0 04/25/2009 0842    A/P: PCS OK for blood if needed.  [name] A. 10/26/2016 1:19 PM

## 2016-10-26 ENCOUNTER — Inpatient Hospital Stay (HOSPITAL_COMMUNITY): Payer: PRIVATE HEALTH INSURANCE | Admitting: Anesthesiology

## 2016-10-26 ENCOUNTER — Inpatient Hospital Stay (HOSPITAL_COMMUNITY)
Admission: AD | Admit: 2016-10-26 | Discharge: 2016-10-29 | DRG: 765 | Disposition: A | Discharge status: Home / Self Care | Payer: PRIVATE HEALTH INSURANCE | Source: Ambulatory Visit | Attending: Obstetrics | Admitting: Obstetrics

## 2016-10-26 ENCOUNTER — Encounter (HOSPITAL_COMMUNITY): Payer: Self-pay | Admitting: *Deleted

## 2016-10-26 ENCOUNTER — Encounter (HOSPITAL_COMMUNITY)
Admission: AD | Disposition: A | Discharge status: Home / Self Care | Payer: Self-pay | Source: Ambulatory Visit | Attending: Obstetrics

## 2016-10-26 DIAGNOSIS — Z6791 Unspecified blood type, Rh negative: Secondary | ICD-10-CM

## 2016-10-26 DIAGNOSIS — O26893 Other specified pregnancy related conditions, third trimester: Secondary | ICD-10-CM | POA: Diagnosis present

## 2016-10-26 DIAGNOSIS — O99344 Other mental disorders complicating childbirth: Secondary | ICD-10-CM | POA: Diagnosis present

## 2016-10-26 DIAGNOSIS — K219 Gastro-esophageal reflux disease without esophagitis: Secondary | ICD-10-CM | POA: Diagnosis present

## 2016-10-26 DIAGNOSIS — Z6841 Body Mass Index (BMI) 40.0 and over, adult: Secondary | ICD-10-CM | POA: Diagnosis not present

## 2016-10-26 DIAGNOSIS — Z3A38 38 weeks gestation of pregnancy: Secondary | ICD-10-CM

## 2016-10-26 DIAGNOSIS — O9962 Diseases of the digestive system complicating childbirth: Secondary | ICD-10-CM | POA: Diagnosis present

## 2016-10-26 DIAGNOSIS — O99354 Diseases of the nervous system complicating childbirth: Principal | ICD-10-CM | POA: Diagnosis present

## 2016-10-26 DIAGNOSIS — O9081 Anemia of the puerperium: Secondary | ICD-10-CM | POA: Diagnosis not present

## 2016-10-26 DIAGNOSIS — O1002 Pre-existing essential hypertension complicating childbirth: Secondary | ICD-10-CM | POA: Diagnosis present

## 2016-10-26 DIAGNOSIS — G932 Benign intracranial hypertension: Secondary | ICD-10-CM | POA: Diagnosis present

## 2016-10-26 DIAGNOSIS — O99844 Bariatric surgery status complicating childbirth: Secondary | ICD-10-CM | POA: Diagnosis present

## 2016-10-26 DIAGNOSIS — O10919 Unspecified pre-existing hypertension complicating pregnancy, unspecified trimester: Secondary | ICD-10-CM | POA: Diagnosis present

## 2016-10-26 DIAGNOSIS — O99824 Streptococcus B carrier state complicating childbirth: Secondary | ICD-10-CM | POA: Diagnosis present

## 2016-10-26 DIAGNOSIS — F419 Anxiety disorder, unspecified: Secondary | ICD-10-CM | POA: Diagnosis present

## 2016-10-26 DIAGNOSIS — D62 Acute posthemorrhagic anemia: Secondary | ICD-10-CM | POA: Diagnosis not present

## 2016-10-26 DIAGNOSIS — O99214 Obesity complicating childbirth: Secondary | ICD-10-CM | POA: Diagnosis present

## 2016-10-26 DIAGNOSIS — Z98891 History of uterine scar from previous surgery: Secondary | ICD-10-CM

## 2016-10-26 SURGERY — Surgical Case
Anesthesia: Spinal | Site: Abdomen | Wound class: Clean Contaminated

## 2016-10-26 MED ORDER — COCONUT OIL OIL
1.0000 "application " | TOPICAL_OIL | Status: DC | PRN
  Administered 2016-10-27: 1 via TOPICAL
  Filled 2016-10-26: qty 120

## 2016-10-26 MED ORDER — PHENYLEPHRINE 8 MG IN D5W 100 ML (0.08MG/ML) PREMIX OPTIME
INJECTION | INTRAVENOUS | Status: AC
  Filled 2016-10-26: qty 100

## 2016-10-26 MED ORDER — IBUPROFEN 600 MG PO TABS
600.0000 mg | ORAL_TABLET | Freq: Four times a day (QID) | ORAL | Status: DC
  Administered 2016-10-26 – 2016-10-29 (×10): 600 mg via ORAL
  Filled 2016-10-26 (×10): qty 1

## 2016-10-26 MED ORDER — CEFAZOLIN SODIUM-DEXTROSE 2-4 GM/100ML-% IV SOLN
2.0000 g | INTRAVENOUS | Status: AC
  Administered 2016-10-26: 2 g via INTRAVENOUS
  Filled 2016-10-26: qty 100

## 2016-10-26 MED ORDER — SENNOSIDES-DOCUSATE SODIUM 8.6-50 MG PO TABS
2.0000 | ORAL_TABLET | ORAL | Status: DC
  Administered 2016-10-26 – 2016-10-28 (×3): 2 via ORAL
  Filled 2016-10-26 (×3): qty 2

## 2016-10-26 MED ORDER — LACTATED RINGERS IV SOLN
INTRAVENOUS | Status: DC
  Administered 2016-10-26: 21:00:00 via INTRAVENOUS

## 2016-10-26 MED ORDER — LABETALOL HCL 100 MG PO TABS
100.0000 mg | ORAL_TABLET | Freq: Two times a day (BID) | ORAL | Status: DC
  Administered 2016-10-26 – 2016-10-29 (×6): 100 mg via ORAL
  Filled 2016-10-26 (×6): qty 1

## 2016-10-26 MED ORDER — STERILE WATER FOR IRRIGATION IR SOLN
Status: DC | PRN
  Administered 2016-10-26: 1

## 2016-10-26 MED ORDER — PHENYLEPHRINE 8 MG IN D5W 100 ML (0.08MG/ML) PREMIX OPTIME
INJECTION | INTRAVENOUS | Status: DC | PRN
  Administered 2016-10-26: 60 ug/min via INTRAVENOUS

## 2016-10-26 MED ORDER — ONDANSETRON HCL 4 MG/2ML IJ SOLN
INTRAMUSCULAR | Status: AC
  Filled 2016-10-26: qty 2

## 2016-10-26 MED ORDER — NALOXONE HCL 2 MG/2ML IJ SOSY
1.0000 ug/kg/h | PREFILLED_SYRINGE | INTRAVENOUS | Status: DC | PRN
  Filled 2016-10-26: qty 2

## 2016-10-26 MED ORDER — MEPERIDINE HCL 25 MG/ML IJ SOLN: 6.2500 mg | INTRAMUSCULAR | Status: DC | PRN

## 2016-10-26 MED ORDER — SOD CITRATE-CITRIC ACID 500-334 MG/5ML PO SOLN
ORAL | Status: AC
  Filled 2016-10-26: qty 15

## 2016-10-26 MED ORDER — DIPHENHYDRAMINE HCL 25 MG PO CAPS
25.0000 mg | ORAL_CAPSULE | ORAL | Status: DC | PRN
  Filled 2016-10-26: qty 1

## 2016-10-26 MED ORDER — OXYTOCIN 40 UNITS IN LACTATED RINGERS INFUSION - SIMPLE MED: 2.5000 [IU]/h | INTRAVENOUS | Status: DC

## 2016-10-26 MED ORDER — ACETAMINOPHEN 500 MG PO TABS: 1000.0000 mg | ORAL_TABLET | Freq: Four times a day (QID) | ORAL | Status: DC

## 2016-10-26 MED ORDER — ACETAMINOPHEN 325 MG PO TABS
650.0000 mg | ORAL_TABLET | ORAL | Status: DC | PRN
  Administered 2016-10-26 – 2016-10-29 (×5): 650 mg via ORAL
  Filled 2016-10-26 (×5): qty 2

## 2016-10-26 MED ORDER — PROMETHAZINE HCL 25 MG/ML IJ SOLN: 6.2500 mg | INTRAMUSCULAR | Status: DC | PRN

## 2016-10-26 MED ORDER — SIMETHICONE 80 MG PO CHEW
80.0000 mg | CHEWABLE_TABLET | ORAL | Status: DC
  Administered 2016-10-26 – 2016-10-28 (×3): 80 mg via ORAL
  Filled 2016-10-26 (×3): qty 1

## 2016-10-26 MED ORDER — KETOROLAC TROMETHAMINE 30 MG/ML IJ SOLN
INTRAMUSCULAR | Status: AC
  Filled 2016-10-26: qty 1

## 2016-10-26 MED ORDER — SIMETHICONE 80 MG PO CHEW
80.0000 mg | CHEWABLE_TABLET | Freq: Three times a day (TID) | ORAL | Status: DC
  Administered 2016-10-26 – 2016-10-29 (×8): 80 mg via ORAL
  Filled 2016-10-26 (×8): qty 1

## 2016-10-26 MED ORDER — KETOROLAC TROMETHAMINE 30 MG/ML IJ SOLN: 30.0000 mg | Freq: Four times a day (QID) | INTRAMUSCULAR | Status: DC | PRN

## 2016-10-26 MED ORDER — BUPIVACAINE IN DEXTROSE 0.75-8.25 % IT SOLN
INTRATHECAL | Status: DC | PRN
  Administered 2016-10-26: 1.4 mL via INTRATHECAL

## 2016-10-26 MED ORDER — MORPHINE SULFATE (PF) 0.5 MG/ML IJ SOLN
INTRAMUSCULAR | Status: AC
  Filled 2016-10-26: qty 10

## 2016-10-26 MED ORDER — SODIUM CHLORIDE 0.9% FLUSH: 3.0000 mL | INTRAVENOUS | Status: DC | PRN

## 2016-10-26 MED ORDER — FENTANYL CITRATE (PF) 100 MCG/2ML IJ SOLN
INTRAMUSCULAR | Status: AC
  Filled 2016-10-26: qty 2

## 2016-10-26 MED ORDER — DIPHENHYDRAMINE HCL 25 MG PO CAPS: 25.0000 mg | ORAL_CAPSULE | Freq: Four times a day (QID) | ORAL | Status: DC | PRN

## 2016-10-26 MED ORDER — BUSPIRONE HCL 10 MG PO TABS
10.0000 mg | ORAL_TABLET | Freq: Two times a day (BID) | ORAL | Status: DC
  Administered 2016-10-26 – 2016-10-29 (×6): 10 mg via ORAL
  Filled 2016-10-26 (×6): qty 1

## 2016-10-26 MED ORDER — TETANUS-DIPHTH-ACELL PERTUSSIS 5-2.5-18.5 LF-MCG/0.5 IM SUSP: 0.5000 mL | Freq: Once | INTRAMUSCULAR | Status: DC

## 2016-10-26 MED ORDER — MENTHOL 3 MG MT LOZG: 1.0000 | LOZENGE | OROMUCOSAL | Status: DC | PRN

## 2016-10-26 MED ORDER — PHENYLEPHRINE 8 MG IN D5W 100 ML (0.08MG/ML) PREMIX OPTIME
INJECTION | INTRAVENOUS | Status: AC
Start: 2016-10-26 — End: 2016-10-26
  Filled 2016-10-26: qty 100

## 2016-10-26 MED ORDER — ONDANSETRON HCL 4 MG/2ML IJ SOLN
INTRAMUSCULAR | Status: DC | PRN
  Administered 2016-10-26: 4 mg via INTRAVENOUS

## 2016-10-26 MED ORDER — NALBUPHINE HCL 10 MG/ML IJ SOLN: 5.0000 mg | INTRAMUSCULAR | Status: DC | PRN

## 2016-10-26 MED ORDER — KETOROLAC TROMETHAMINE 30 MG/ML IJ SOLN
30.0000 mg | Freq: Four times a day (QID) | INTRAMUSCULAR | Status: DC | PRN
  Administered 2016-10-26: 30 mg via INTRAMUSCULAR

## 2016-10-26 MED ORDER — DIBUCAINE 1 % RE OINT: 1.0000 "application " | TOPICAL_OINTMENT | RECTAL | Status: DC | PRN

## 2016-10-26 MED ORDER — NALOXONE HCL 0.4 MG/ML IJ SOLN: 0.4000 mg | INTRAMUSCULAR | Status: DC | PRN

## 2016-10-26 MED ORDER — BUPIVACAINE IN DEXTROSE 0.75-8.25 % IT SOLN
INTRATHECAL | Status: AC
  Filled 2016-10-26: qty 2

## 2016-10-26 MED ORDER — DEXAMETHASONE SODIUM PHOSPHATE 4 MG/ML IJ SOLN
INTRAMUSCULAR | Status: DC | PRN
  Administered 2016-10-26: 4 mg via INTRAVENOUS

## 2016-10-26 MED ORDER — LACTATED RINGERS IV SOLN
INTRAVENOUS | Status: DC | PRN
  Administered 2016-10-26: 14:00:00 via INTRAVENOUS

## 2016-10-26 MED ORDER — OXYTOCIN 10 UNIT/ML IJ SOLN
INTRAMUSCULAR | Status: AC
  Filled 2016-10-26: qty 4

## 2016-10-26 MED ORDER — PRENATAL MULTIVITAMIN CH
1.0000 | ORAL_TABLET | Freq: Every day | ORAL | Status: DC
  Administered 2016-10-27 – 2016-10-28 (×2): 1 via ORAL
  Filled 2016-10-26 (×2): qty 1

## 2016-10-26 MED ORDER — WITCH HAZEL-GLYCERIN EX PADS: 1.0000 "application " | MEDICATED_PAD | CUTANEOUS | Status: DC | PRN

## 2016-10-26 MED ORDER — ONDANSETRON HCL 4 MG/2ML IJ SOLN: 4.0000 mg | Freq: Three times a day (TID) | INTRAMUSCULAR | Status: DC | PRN

## 2016-10-26 MED ORDER — NALBUPHINE HCL 10 MG/ML IJ SOLN: 5.0000 mg | Freq: Once | INTRAMUSCULAR | Status: DC | PRN

## 2016-10-26 MED ORDER — LACTATED RINGERS IV SOLN
INTRAVENOUS | Status: DC
  Administered 2016-10-26: 13:00:00 via INTRAVENOUS

## 2016-10-26 MED ORDER — DEXAMETHASONE SODIUM PHOSPHATE 4 MG/ML IJ SOLN
INTRAMUSCULAR | Status: AC
Start: 2016-10-26 — End: 2016-10-26
  Filled 2016-10-26: qty 1

## 2016-10-26 MED ORDER — KETOROLAC TROMETHAMINE 30 MG/ML IJ SOLN: 30.0000 mg | Freq: Once | INTRAMUSCULAR | Status: DC | PRN

## 2016-10-26 MED ORDER — MORPHINE SULFATE (PF) 0.5 MG/ML IJ SOLN
INTRAMUSCULAR | Status: DC | PRN
  Administered 2016-10-26: .2 mg via INTRATHECAL

## 2016-10-26 MED ORDER — SIMETHICONE 80 MG PO CHEW: 80.0000 mg | CHEWABLE_TABLET | ORAL | Status: DC | PRN

## 2016-10-26 MED ORDER — SODIUM CHLORIDE 0.9 % IR SOLN
Status: DC | PRN
  Administered 2016-10-26: 1

## 2016-10-26 MED ORDER — ZOLPIDEM TARTRATE 5 MG PO TABS: 5.0000 mg | ORAL_TABLET | Freq: Every evening | ORAL | Status: DC | PRN

## 2016-10-26 MED ORDER — SCOPOLAMINE 1 MG/3DAYS TD PT72: 1.0000 | MEDICATED_PATCH | Freq: Once | TRANSDERMAL | Status: DC

## 2016-10-26 MED ORDER — OXYTOCIN 10 UNIT/ML IJ SOLN
INTRAVENOUS | Status: DC | PRN
  Administered 2016-10-26: 40 [IU] via INTRAVENOUS

## 2016-10-26 MED ORDER — DIPHENHYDRAMINE HCL 50 MG/ML IJ SOLN: 12.5000 mg | INTRAMUSCULAR | Status: DC | PRN

## 2016-10-26 MED ORDER — FLUOXETINE HCL 20 MG PO CAPS
40.0000 mg | ORAL_CAPSULE | Freq: Every day | ORAL | Status: DC
  Administered 2016-10-27 – 2016-10-29 (×3): 40 mg via ORAL
  Filled 2016-10-26 (×4): qty 2

## 2016-10-26 MED ORDER — FENTANYL CITRATE (PF) 100 MCG/2ML IJ SOLN
INTRAMUSCULAR | Status: DC | PRN
  Administered 2016-10-26: 20 ug via INTRATHECAL

## 2016-10-26 SURGICAL SUPPLY — 34 items
ADH SKN CLS APL DERMABOND .7 (GAUZE/BANDAGES/DRESSINGS) ×1
CHLORAPREP W/TINT 26ML (MISCELLANEOUS) ×2 IMPLANT
CLAMP CORD UMBIL (MISCELLANEOUS) IMPLANT
CLOTH BEACON ORANGE TIMEOUT ST (SAFETY) ×2 IMPLANT
CONTAINER PREFILL 10% NBF 15ML (MISCELLANEOUS) IMPLANT
DERMABOND ADVANCED (GAUZE/BANDAGES/DRESSINGS) ×1
DERMABOND ADVANCED .7 DNX12 (GAUZE/BANDAGES/DRESSINGS) IMPLANT
DRSG OPSITE POSTOP 4X10 (GAUZE/BANDAGES/DRESSINGS) ×2 IMPLANT
ELECT REM PT RETURN 9FT ADLT (ELECTROSURGICAL) ×2
ELECTRODE REM PT RTRN 9FT ADLT (ELECTROSURGICAL) ×1 IMPLANT
EXTRACTOR VACUUM M CUP 4 TUBE (SUCTIONS) IMPLANT
GLOVE BIO SURGEON STRL SZ 6.5 (GLOVE) ×2 IMPLANT
GLOVE BIOGEL PI IND STRL 7.0 (GLOVE) ×2 IMPLANT
GLOVE BIOGEL PI INDICATOR 7.0 (GLOVE) ×2
GOWN STRL REUS W/TWL LRG LVL3 (GOWN DISPOSABLE) ×4 IMPLANT
KIT ABG SYR 3ML LUER SLIP (SYRINGE) IMPLANT
NEEDLE HYPO 22GX1.5 SAFETY (NEEDLE) IMPLANT
NEEDLE HYPO 25X5/8 SAFETYGLIDE (NEEDLE) IMPLANT
NS IRRIG 1000ML POUR BTL (IV SOLUTION) ×2 IMPLANT
PACK C SECTION WH (CUSTOM PROCEDURE TRAY) ×2 IMPLANT
PAD OB MATERNITY 4.3X12.25 (PERSONAL CARE ITEMS) ×2 IMPLANT
PENCIL SMOKE EVAC W/HOLSTER (ELECTROSURGICAL) ×2 IMPLANT
STRIP CLOSURE SKIN 1/2X4 (GAUZE/BANDAGES/DRESSINGS) IMPLANT
SUT MON AB 4-0 PS1 27 (SUTURE) ×2 IMPLANT
SUT PLAIN 0 NONE (SUTURE) IMPLANT
SUT PLAIN 2 0 XLH (SUTURE) IMPLANT
SUT VIC AB 0 CT1 36 (SUTURE) ×4 IMPLANT
SUT VIC AB 0 CTX 36 (SUTURE) ×4
SUT VIC AB 0 CTX36XBRD ANBCTRL (SUTURE) ×2 IMPLANT
SUT VIC AB 2-0 CT1 27 (SUTURE) ×2
SUT VIC AB 2-0 CT1 TAPERPNT 27 (SUTURE) ×1 IMPLANT
SYR CONTROL 10ML LL (SYRINGE) IMPLANT
TOWEL OR 17X24 6PK STRL BLUE (TOWEL DISPOSABLE) ×2 IMPLANT
TRAY FOLEY BAG SILVER LF 14FR (SET/KITS/TRAYS/PACK) IMPLANT

## 2016-10-26 NOTE — Lactation Note (Addendum)
This note was copied from a baby's chart. Lactation Consultation Note  Patient Name: Girl Zoann Aguallo [id number] Date: 10/26/2016 Reason for consult: Initial assessment;Primapara  Mom called out for latch assist. Infant did maintain latches slightly longer, but overall, there was not noticeable improvement. (Infant had fallen asleep during latch attempt post-frenotomy). Mom is not interested in supplementing at breast at this time & infant hungry, so infant given a bottle. Dad was taught how to do paced bottle-feeding. Infant took 74ml w/ease. Mom does plan to still offer breast before offering bottle.  Mom will start pumping (likely tomorrow morning). If RN starts her pumping, I suggested that Mom use the size 24 flanges.  Lurline Hare [facility] 10/26/2016, 10:37 PM

## 2016-10-26 NOTE — Lactation Note (Signed)
This note was copied from a baby's chart. Lactation Consultation Note  Patient Name: [name] [id number] Date: 10/26/2016 Reason for consult: Initial assessment;Primapara  Initial visit at 4 hours of life. Mom is a P1 who reports + breast changes w/pregnancy. I received a call from RN stating that infant had a tongue-tie and was unable to latch. Mom reports having had a tongue-tie, also.   I assisted w/latching, but, repeatedly infant could only maintain latch for a few seconds before letting go. A nipple shield (size 24) was applied & infant was able to continuously suckle, but she was unable to get good depth. There was no colostrum noted in nipple shield when infant released latch. Infant was fussy & hungry. Formula was given via 5Fr & syringe at breast. Infant was satiated after 6 mL.  To facilitate feedings, subsequent feedings will take place with a bottle & Mom will pump.  Mom has my # to call when ready for me to return to show paced bottle feeding and/or how to use DEBP. Mom is on: fluoxetine 40mg  qd (L2); buspirone 10mg  bid (L3); and labetalol 100mg  bid (L2).    Lurline Hare [facility] 10/26/2016, 7:24 PM

## 2016-10-26 NOTE — Transfer of Care (Signed)
Immediate Anesthesia Transfer of Care Note  Patient: [name]  Procedure(s) Performed: Procedure(s) with comments: Primary CESAREAN SECTION (N/A) - EDD: 11/06/16  Patient Location: PACU  Anesthesia Type:Spinal  Level of Consciousness: awake, alert , oriented and patient cooperative  Airway & Oxygen Therapy: Patient Spontanous Breathing  Post-op Assessment: Report given to RN and Post -op Vital signs reviewed and stable  Post vital signs: Reviewed and stable  Last Vitals:  Vitals:   10/26/16 1102 10/26/16 1443  BP: (!) 142/88   Pulse: 89   Resp: 18   Temp: 36.8 C   SpO2:  100%    Last Pain:  Vitals:   10/26/16 1102  TempSrc: Oral  PainSc: 5       Patients Stated Pain Goal: 6 (10/26/16 1102)  Complications: No apparent anesthesia complications

## 2016-10-26 NOTE — Op Note (Signed)
10/26/2016  2:50 PM  PATIENT:  [name]  36 y.o. female  PRE-OPERATIVE DIAGNOSIS:  Chronic Hypertension, History of Pseudotumor Cerebri  POST-OPERATIVE DIAGNOSIS:  Chronic Hypertension, History of Pseudotumor Cerebri  PROCEDURE:  Procedure(s) with comments: Primary CESAREAN SECTION (N/A) - EDD: 11/06/16  Low-transverse cesarean section with 2 layer closure  SURGEON:  Surgeon(s) and Role:    * Noland Fordyce, MD - Primary  PHYSICIAN ASSISTANT:   ASSISTANTS: Arlan Organ, CNM   ANESTHESIA:   spinal  EBL:  Total I/O In: 1700 [I.V.:1700] Out: 800 [Urine:50; Blood:750]  BLOOD ADMINISTERED:none  DRAINS: Urinary Catheter (Foley)   LOCAL MEDICATIONS USED:  NONE  SPECIMEN:  Source of Specimen:  Placenta  DISPOSITION OF SPECIMEN:  Labor and delivery  COUNTS:  YES  TOURNIQUET:  * No tourniquets in log *  DICTATION: .Dragon Dictation  PLAN OF CARE: Admit to inpatient   PATIENT DISPOSITION:  PACU - hemodynamically stable.   Delay start of Pharmacological VTE agent (>24hrs) due to surgical blood loss or risk of bleeding: yes     Findings:  @BABYSEXEBC @ infant,  APGAR (1 MIN): 8   APGAR (5 MINS): 8   APGAR (10 MINS):   Normal uterus, tubes and ovaries, normal placenta. 3VC, clear amniotic fluid  EBL: 750 cc Antibiotics:  2g Ancef Complications: none  Indications: This is a 36 y.o. year-old, G2 P0010  At [redacted]w[redacted]d admitted for chronic hypertension and headache with visual changes in the setting of pseudotumor cerebri and elevated intracranial pressures. Neurology had recommended against vaginal delivery due to risks of worsening neurologic symptoms with Valsalva. Patient had been given the option for child of labor with the approval of maternal fetal medicine however patient did not want to take any chances of worsening neurologic symptoms. Given her pelvic shape as well patient had a low likelihood for successful vaginal delivery and opted for primary cesarean  section. Risks benefits and alternatives of the procedure were discussed with the patient who agreed to proceed  Procedure:  After informed consent was obtained the patient was taken to the operating room where spinal anesthesia was initiated without difficulty.  She was prepped and draped in the normal sterile fashion in dorsal supine position with a leftward tilt.  A foley catheter was in place.  A Pfannenstiel skin incision was made 2 cm above the pubic symphysis in the midline with the scalpel.  Dissection was carried down with the Bovie cautery until the fascia was reached. The fascia was incised in the midline. The incision was extended laterally with the Mayo scissors. The inferior aspect of the fascial incision was grasped with the Coker clamps, elevated up and the underlying rectus muscles were dissected off sharply. The superior aspect of the fascial incision was grasped with the Coker clamps elevated up and the underlying rectus muscles were dissected off sharply.  The peritoneum was entered bluntly. The peritoneal incision was extended superiorly and inferiorly with good visualization of the bladder. The bladder blade was inserted and palpation was done to assess the fetal position and the location of the uterine vessels. The lower segment of the uterus was incised sharply with the scalpel and extended  bluntly in the cephalo-caudal fashion. The infant was grasped, brought to the incision,  rotated and the infant was delivered with fundal pressure. The nose and mouth were bulb suctioned. The cord was clamped and cut after 1 minute delay. The infant was handed off to the waiting pediatrician. The placenta was expressed. The uterus was exteriorized.  The uterus was cleared of all clots and debris. The uterine incision was repaired with 0 Vicryl in a running locked fashion.  A second layer of the same suture was used in an imbricating fashion to obtain excellent hemostasis. An additional figure-of-eight  suture of 0 Vicryl was placed at the left angle of the incision.  The uterus was then returned to the abdomen, the gutters were cleared of all clots and debris. The uterine incision was reinspected and found to be hemostatic. The peritoneum was grasped and closed with 2-0 Vicryl in a running fashion. The cut muscle edges and the underside of the fascia were inspected and found to be hemostatic. The fascia was closed with 0 Vicryl in a single layer . The subcutaneous tissue was irrigated. Scarpa's layer was closed with a 2-0 plain gut suture. The skin was closed with a 4-0 Monocryl in a single layer. The patient tolerated the procedure well. Sponge lap and needle counts were correct x3 and patient was taken to the recovery room in a stable condition.  [name] A. 10/26/2016 2:51 PM

## 2016-10-26 NOTE — Anesthesia Procedure Notes (Signed)
Spinal  Start time: 10/26/2016 1:22 PM End time: 10/26/2016 1:26 PM Staffing Anesthesiologist: Leilani Able Performed: anesthesiologist  Preanesthetic Checklist Completed: patient identified, surgical consent, pre-op evaluation, timeout performed, IV checked, risks and benefits discussed and monitors and equipment checked Spinal Block Patient position: sitting Prep: site prepped and draped and DuraPrep Patient monitoring: cardiac monitor, heart rate, continuous pulse ox and blood pressure Approach: midline Location: L3-4 Injection technique: single-shot Needle Needle type: Pencan  Needle gauge: 24 G Needle insertion depth: 7 cm Assessment Sensory level: T4

## 2016-10-26 NOTE — Anesthesia Postprocedure Evaluation (Signed)
Anesthesia Post Note  Patient: [name]  Procedure(s) Performed: Procedure(s) (LRB): Primary CESAREAN SECTION (N/A)     Patient location during evaluation: Mother Baby Anesthesia Type: Spinal Level of consciousness: awake and alert Pain management: pain level controlled Vital Signs Assessment: post-procedure vital signs reviewed and stable Respiratory status: spontaneous breathing, nonlabored ventilation and respiratory function stable Cardiovascular status: stable Postop Assessment: no headache, no backache and epidural receding Anesthetic complications: no    Last Vitals:  Vitals:   10/26/16 1549 10/26/16 1704  BP: 139/67 134/78  Pulse: 83 75  Resp: 18 18  Temp: 37 C 36.7 C  SpO2: 98% 98%    Last Pain:  Vitals:   10/26/16 1709  TempSrc:   PainSc: 4    Pain Goal: Patients Stated Pain Goal: 6 (10/26/16 1102)               Olive Zmuda

## 2016-10-26 NOTE — Brief Op Note (Signed)
10/26/2016  2:50 PM  PATIENT:  [name]  36 y.o. female  PRE-OPERATIVE DIAGNOSIS:  Chronic Hypertension, History of Pseudotumor Cerebri  POST-OPERATIVE DIAGNOSIS:  Chronic Hypertension, History of Pseudotumor Cerebri  PROCEDURE:  Procedure(s) with comments: Primary CESAREAN SECTION (N/A) - EDD: 11/06/16  Low-transverse cesarean section with 2 layer closure  SURGEON:  Surgeon(s) and Role:    * Noland Fordyce, MD - Primary  PHYSICIAN ASSISTANT:   ASSISTANTS: Arlan Organ, CNM   ANESTHESIA:   spinal  EBL:  Total I/O In: 1700 [I.V.:1700] Out: 800 [Urine:50; Blood:750]  BLOOD ADMINISTERED:none  DRAINS: Urinary Catheter (Foley)   LOCAL MEDICATIONS USED:  NONE  SPECIMEN:  Source of Specimen:  Placenta  DISPOSITION OF SPECIMEN:  Labor and delivery  COUNTS:  YES  TOURNIQUET:  * No tourniquets in log *  DICTATION: .Dragon Dictation  PLAN OF CARE: Admit to inpatient   PATIENT DISPOSITION:  PACU - hemodynamically stable.   Delay start of Pharmacological VTE agent (>24hrs) due to surgical blood loss or risk of bleeding: yes

## 2016-10-26 NOTE — Anesthesia Preprocedure Evaluation (Signed)
Anesthesia Evaluation  Patient identified by MRN, date of birth, ID band Patient awake    Reviewed: Allergy & Precautions, NPO status , Patient's Chart, lab work & pertinent test results  Airway Mallampati: II       Dental no notable dental hx. (+) Teeth Intact   Pulmonary    Pulmonary exam normal breath sounds clear to auscultation       Cardiovascular hypertension, Pt. on home beta blockers Normal cardiovascular exam Rhythm:Regular Rate:Normal     Neuro/Psych    GI/Hepatic Neg liver ROS,   Endo/Other  Morbid obesity  Renal/GU negative Renal ROS     Musculoskeletal negative musculoskeletal ROS (+)   Abdominal (+) + obese,   Peds  Hematology   Anesthesia Other Findings   Reproductive/Obstetrics                             Anesthesia Physical Anesthesia Plan  ASA: III  Anesthesia Plan: Spinal   Post-op Pain Management:    Induction:   PONV Risk Score and Plan: 3 and Ondansetron, Dexamethasone, Midazolam and Propofol infusion  Airway Management Planned:   Additional Equipment:   Intra-op Plan:   Post-operative Plan:   Informed Consent: I have reviewed the patients History and Physical, chart, labs and discussed the procedure including the risks, benefits and alternatives for the proposed anesthesia with the patient or authorized representative who has indicated his/her understanding and acceptance.     Plan Discussed with: CRNA and Surgeon  Anesthesia Plan Comments:         Anesthesia Quick Evaluation

## 2016-10-27 LAB — TYPE AND SCREEN
ABO/RH(D): O NEG
ANTIBODY SCREEN: POSITIVE
DAT, IgG: NEGATIVE
DONOR AG TYPE: NEGATIVE
DONOR AG TYPE: NEGATIVE
PT AG Type: NEGATIVE
UNIT DIVISION: 0
Unit division: 0

## 2016-10-27 LAB — CBC
HCT: 31.3 % — ABNORMAL LOW (ref 36.0–46.0)
Hemoglobin: 10.7 g/dL — ABNORMAL LOW (ref 12.0–15.0)
MCH: 29.1 pg (ref 26.0–34.0)
MCHC: 34.2 g/dL (ref 30.0–36.0)
MCV: 85.1 fL (ref 78.0–100.0)
PLATELETS: 160 10*3/uL (ref 150–400)
RBC: 3.68 MIL/uL — AB (ref 3.87–5.11)
RDW: 14.6 % (ref 11.5–15.5)
WBC: 9.4 10*3/uL (ref 4.0–10.5)

## 2016-10-27 LAB — BPAM RBC
BLOOD PRODUCT EXPIRATION DATE: 201809212359
BLOOD PRODUCT EXPIRATION DATE: 201809212359
UNIT TYPE AND RH: 9500
Unit Type and Rh: 9500

## 2016-10-27 MED ORDER — OXYCODONE HCL 5 MG PO TABS
10.0000 mg | ORAL_TABLET | ORAL | Status: DC | PRN
  Administered 2016-10-28: 10 mg via ORAL
  Filled 2016-10-27: qty 2

## 2016-10-27 MED ORDER — POLYSACCHARIDE IRON COMPLEX 150 MG PO CAPS
150.0000 mg | ORAL_CAPSULE | Freq: Every day | ORAL | Status: DC
  Administered 2016-10-27 – 2016-10-29 (×3): 150 mg via ORAL
  Filled 2016-10-27 (×3): qty 1

## 2016-10-27 MED ORDER — MAGNESIUM OXIDE 400 (241.3 MG) MG PO TABS
400.0000 mg | ORAL_TABLET | Freq: Every day | ORAL | Status: DC
  Administered 2016-10-27 – 2016-10-29 (×3): 400 mg via ORAL
  Filled 2016-10-27 (×4): qty 1

## 2016-10-27 MED ORDER — OXYCODONE HCL 5 MG PO TABS
5.0000 mg | ORAL_TABLET | ORAL | Status: DC | PRN
  Administered 2016-10-27 – 2016-10-28 (×2): 5 mg via ORAL
  Filled 2016-10-27 (×2): qty 1

## 2016-10-27 NOTE — Progress Notes (Signed)
MOB was referred for history of depression/anxiety. * Referral screened out by Clinical Social Worker because none of the following criteria appear to apply: ~ History of anxiety/depression during this pregnancy, or of post-partum depression. ~ Diagnosis of anxiety and/or depression within last 3 years OR * MOB's symptoms currently being treated with medication and/or therapy.  Please contact the Clinical Social Worker if needs arise, by MOB request, or if MOB scores 9 or greater/yes to question 10 on Edinburgh Postpartum Depression Screen.   Jaceyon Strole Boyd-Gilyard, MSW, LCSW Clinical Social Work [phone]  

## 2016-10-27 NOTE — Progress Notes (Signed)
Patient called out to nurses' station saying her IV was leaking. Per Marcelino Duster RN pt's IV was out and site looked as though IV had infiltrated when she went to assess pt. Patient told this RN that she did not want to be "stuck again" for another IV. Encouraged patient to drink plenty of fluids d/t amber urine and to increase output. Will continue to monitor pt's output.

## 2016-10-27 NOTE — Lactation Note (Signed)
This note was copied from a baby's chart. Lactation Consultation Note  Patient Name: [name] EVOJJ'K Date: 10/27/2016 Reason for consult: Follow-up assessment Mom states she has been attempting to latch baby but she is not sustaining latch.  She just pumped and obtained a few drops.  Reassured and discussed milk coming to volume.  Positioned baby in football hold on right side.  Attempted hand expression but no colostrum seen.  After a few attempts baby latched well and fed actively.  She pulled off some initially probably seeking a faster flow. Instructed to feed with any feeding cue and call out for assist prn.  Recommended post pumping every 3 hours and supplementing with expressed milk/formula if baby still hungry.  Maternal Data    Feeding Feeding Type: Breast Fed Nipple Type: Slow - flow Length of feed: 7 min  LATCH Score Latch: Grasps breast easily, tongue down, lips flanged, rhythmical sucking.  Audible Swallowing: None  Type of Nipple: Everted at rest and after stimulation  Comfort (Breast/Nipple): Soft / non-tender  Hold (Positioning): Assistance needed to correctly position infant at breast and maintain latch.  LATCH Score: 7  Interventions Interventions: Breast feeding basics reviewed;Assisted with latch;Breast compression;Adjust position;Skin to skin;Support pillows;Breast massage;Position options;Hand express  Lactation Tools Discussed/Used     Consult Status Consult Status: Follow-up Date: 10/28/16 Follow-up type: In-patient    Huston Foley 10/27/2016, 11:09 AM

## 2016-10-27 NOTE — Progress Notes (Signed)
POSTOPERATIVE DAY # 1 S/P Primary LTCS for hx. 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 Has tried pumping once this morning.    O:  VS: BP 139/77 (BP Location: Right Arm)   Pulse 79   Temp 98.5 F (36.9 C) (Oral)   Resp 18   Ht 5\' 4"  (1.626 m)   Wt 110.2 kg (243 lb)   SpO2 98%   BMI 41.71 kg/m    LABS:               Recent Labs  10/27/16 0533  WBC 9.4  HGB 10.7*  PLT 160               Bloodtype: --/--/O NEG (08/17 1430)  Rubella: Immune (02/08 0000)                     Baby RH Negative                          I&O: Intake/Output      08/20 0701 - 08/21 0700 08/21 0701 - 08/22 0700   P.O. 700    I.V. (mL/kg) 2100 (19.1)    Total Intake(mL/kg) 2800 (25.4)    Urine (mL/kg/hr) 1925 600 (1.2)   Blood 750    Total Output 2675 600   Net +125 -600                     Physical Exam:             Alert and Oriented X3  Lungs: Clear and unlabored  Heart: regular rate and rhythm / no mumurs  Abdomen: soft, non-tender, non-distended, active bowel sounds in all quadrants             Fundus: firm, non-tender, U-1             Dressing: honeycomb dsg c/d/i              Incision:  approximated with sutures / no erythema / no ecchymosis  / no drainage  Perineum: intact  Lochia: appropriate, no clots  Extremities: +1 BLE edema, no calf pain or tenderness,  A/P:     POD # 1 S/P Primary LTCS for hx. Of pseudotumor cerebri   Pseudotumor Cerebri    - s/p serial LPs during pregnancy    - No HAs since delivery  Chronic HTN    - stable on Labetalol 100mg  BID  Anxiety    - stable on Prozac 40mg  and Buspar 10mg     - CSW consult for additional resources and support   - F/u with PCP in 1 week for evaluation   RH Negative    - baby RH Negative - no Rhogam indicated   Weak Anti-D titer in pregnancy    - too weak to titer, baby showed weak D titer   Weak Anti-E titer on admission    - DAT Neg  Hx. Of Gastric Sleeve  Hx. Of Infertility - s/p Clomid pregnancy              Routine postoperative care              May shower today   Continue current care  See lactation today   Anticipate discharge home Thursday  Carlean Jews, MSN, CNM [facility]

## 2016-10-28 MED ORDER — OXYCODONE-ACETAMINOPHEN 5-325 MG PO TABS
2.0000 | ORAL_TABLET | ORAL | Status: DC | PRN
  Administered 2016-10-28: 2 via ORAL
  Filled 2016-10-28: qty 2

## 2016-10-28 MED ORDER — OXYCODONE-ACETAMINOPHEN 5-325 MG PO TABS
1.0000 | ORAL_TABLET | ORAL | Status: DC | PRN
  Administered 2016-10-28: 1 via ORAL
  Filled 2016-10-28: qty 1

## 2016-10-28 NOTE — Lactation Note (Signed)
This note was copied from a baby's chart. Lactation Consultation Note  Patient Name: Girl Timberlynn Stanback [id number] Date: 10/28/2016 Reason for consult: Follow-up assessment;Engorgement  Baby 50 hours old. Mom reports that baby's lingual frenulum was clipped and baby has been latching better, especially on the left breast. Assisted to latch baby to right breast in both football and cross-cradle position, but mom's breast are full, tight and lumpy. Discussed with mom the need to soften breasts before baby can latch. Mom used DEBP and able to collect 1 ml of EBM. Assisted mom with use of manual pump and mom more easily expressible. Brought ice to mom and enc mom to use manual pump after icing. Enc mom to use EBM at next feeding, and discussed EBM storage guidelines. Plan is for mom to continue icing and expressing to soften breast, nurse with cues and then supplement with EBM/formula as needed. Enc mom to post-pump as needed and call for assistance with latching if needed. Discussed with mom that engorgement usually last 12-24 hours.  Discussed assessment and interventions with Maralyn Sago, RN.   Maternal Data    Feeding Feeding Type: Breast Fed Length of feed: 0 min  LATCH Score Latch: Repeated attempts needed to sustain latch, nipple held in mouth throughout feeding, stimulation needed to elicit sucking reflex.  Audible Swallowing: None  Type of Nipple: Everted at rest and after stimulation  Comfort (Breast/Nipple): Filling, red/small blisters or bruises, mild/mod discomfort  Hold (Positioning): Assistance needed to correctly position infant at breast and maintain latch.  LATCH Score: 5  Interventions Interventions: Assisted with latch;Pre-pump if needed;Support pillows;Adjust position;Position options;Coconut oil;Expressed milk  Lactation Tools Discussed/Used Tools: Pump Breast pump type: Double-Electric Breast Pump;Manual Pump Review: Setup, frequency, and cleaning;Milk  Storage Initiated by:: bedside RN Date initiated:: 10/27/16   Consult Status Consult Status: Follow-up Date: 10/29/16 Follow-up type: In-patient    Sherlyn Hay 10/28/2016, 3:51 PM

## 2016-10-28 NOTE — Progress Notes (Signed)
POSTOPERATIVE DAY # 2 S/P S/P Primary LTCS for hx. Of pseudotumor cerebri    S:         Reports feeling very tired and more sore             Tolerating po intake / no nausea / no vomiting / + flatus / no BM  Denies dizziness, SOB, or CP             Bleeding is moderate             Pain controlled with Motrin and Oxycodone IR             Up ad lib / ambulatory/ voiding QS  Newborn breast feeding with formula supplementation - states baby was cluster feeding all night, and may possibly give up on breastfeeding because it is making her anxiety worse    O:  VS: BP 127/78 (BP Location: Right Arm)   Pulse 79   Temp 98.2 F (36.8 C) (Oral)   Resp 18   Ht 5\' 4"  (1.626 m)   Wt 110.2 kg (243 lb)   SpO2 98%   BMI 41.71 kg/m    LABS:               Recent Labs  10/27/16 0533  WBC 9.4  HGB 10.7*  PLT 160               Bloodtype: --/--/O NEG (08/17 1430)  Rubella: Immune (02/08 0000)                                             I&O: Intake/Output      08/21 0701 - 08/22 0700 08/22 0701 - 08/23 0700   Urine (mL/kg/hr) 600 (0.2)    Total Output 600     Net -600                       Physical Exam:             Alert and Oriented X3  Lungs: Clear and unlabored  Heart: regular rate and rhythm / no mumurs  Abdomen: soft, non-tender, non-distended, active bowel sounds in all quadrants             Fundus: firm, non-tender, U-1             Dressing: honeycomb dsg c/d/i              Incision:  approximated with sutures / no erythema / no ecchymosis / no drainage  Perineum: intact  Lochia: appropriate, no clots  Extremities: trace pedal edema, no calf pain or tenderness,   A/P:     POD # 2 S/P Primary LTCS for hx. Of pseudotumor cerebri              Pseudotumor Cerebri                          - s/p serial LPs during pregnancy                          - No HAs since delivery              Chronic HTN                          -  stable on Labetalol 100mg  BID             Anxiety                      - stable on Prozac 40mg  and Buspar 10mg                           - CSW consult for additional resources and support                         - F/u with PCP in 1 week for evaluation, encouraged to call earlier if needs to increase medication              RH Negative                          - baby RH Negative - no Rhogam indicated              Weak Anti-D titer in pregnancy                          - too weak to titer, baby showed weak D titer              Weak Anti-E titer on admission                          - DAT Neg             Hx. Of Gastric Sleeve             Hx. Of Infertility - s/p Clomid pregnancy              Routine postoperative care              May shower today              Continue current care             See lactation today   Encouraged ambulation today  Change pain meds to Percocet as pt. Stopped receiving Tylenol PRN             Anticipate discharge home Thursday  Carlean Jews, MSN, [facility] OB/GYN & Infertility

## 2016-10-28 NOTE — Progress Notes (Signed)
CSW received consult for MH hx.    When CSW arrived, MOB was resting and in bed and had several room guest.  CSW offered to return at a later time, however, MOB insisted that CSW met with MOB at this time (FOB, MOB's nephew, and MOB's sister in-law were present).  CSW explained, CSW's role and encouraged MOB to ask questions.    CSW inquired about MOB's MH and MOB acknowledged a hx of anxiety and depression.  MOB presented with insight and awareness and acknowledged symptoms being managed with Buspar and Prozac. MOB also reported being an established patient at an outpatient counseling center in [place]. MOB receptive to a referral to a local counseling agency.   CSW provided education regarding Baby Blues vs PMADs and provided MOB with information about support groups held at [facility] encouraged MOB to evaluate her mental health throughout the postpartum period with the use of the New Mom Checklist developed by Postpartum Progress and notify a medical professional if symptoms arise.    CSW assessed for safety and MOB denied SI and HI.  MOB reports having a wealth of supports and feeling prepared to parent.   There are no barriers to d/c.  Laurey Arrow, MSW, LCSW Clinical Social Work [phone]

## 2016-10-29 MED ORDER — OXYCODONE-ACETAMINOPHEN 5-325 MG PO TABS: 1.0000 | ORAL_TABLET | ORAL | 0 refills | Status: DC | PRN

## 2016-10-29 MED ORDER — IBUPROFEN 600 MG PO TABS: 600.0000 mg | ORAL_TABLET | Freq: Four times a day (QID) | ORAL | 0 refills | Status: DC

## 2016-10-29 MED ORDER — MAGNESIUM OXIDE 400 (241.3 MG) MG PO TABS: 400.0000 mg | ORAL_TABLET | Freq: Every day | ORAL | 0 refills | Status: DC

## 2016-10-29 NOTE — Lactation Note (Signed)
This note was copied from a baby's chart. Lactation Consultation Note  Patient Name: [name] [id number] Date: 10/29/2016 Reason for consult: Follow-up assessment   Mother states she is giving formula because she is having difficulty latching due to engorgement. Noted at this time engorgement has resolved and breasts are filling and semi soft. Suggest mother prepump w/ hand pump to help evert nipples. Discussed supply and demand and how bf often or pumping q 3 hours will help maintain milk supply. Mom encouraged to feed baby 8-12 times/24 hours and with feeding cues.  Reviewed engorgement care and monitoring voids/stools.     Maternal Data    Feeding    LATCH Score                   Interventions    Lactation Tools Discussed/Used     Consult Status Consult Status: Complete    Hardie Pulley 10/29/2016, 11:34 AM

## 2016-10-29 NOTE — Progress Notes (Signed)
POSTOPERATIVE DAY # 3 S/P CS   S:         Reports feeling well             Tolerating po intake / no nausea / no vomiting / + flatus / no BM             Bleeding is light             Pain controlled with motrin and oxycodone             Up ad lib / ambulatory/ voiding QS  Newborn breast and formula feeding  O:  VS: BP 139/84 (BP Location: Right Arm)   Pulse 78   Temp 99.8 F (37.7 C) (Oral)   Resp 20   Ht 5\' 4"  (1.626 m)   Wt 110.2 kg (243 lb)   SpO2 99%   BMI 41.71 kg/m    LABS:               Recent Labs  10/27/16 0533  WBC 9.4  HGB 10.7*  PLT 160               Bloodtype: --/--/O NEG (08/17 1430) / newborn Rh negative  Rubella: Immune (02/08 0000)                                                       Physical Exam:             Alert and Oriented X3  Lungs: Clear and unlabored  Heart: regular rate and rhythm / no mumurs  Abdomen: soft, non-tender, non-distended              Fundus: firm, non-tender, U-1             Dressing intact              Incision:  approximated with suture / no erythema / no ecchymosis / no drainage  Perineum: intact  Lochia: light  Extremities: trace edema, no calf pain or tenderness, negative Homans  A:        POD # 3 S/P CS            Mild ABL anemia  P:        Routine postoperative care              DC home - WOB booklet - instructions reveiwed     Marlinda Mike CNM, MSN, FACNM 10/29/2016, 10:25 AM

## 2016-10-29 NOTE — Discharge Summary (Signed)
OB Discharge Summary  Patient Name: [name] DOB: 1980/10/06 MRN: [id number]  Date of admission: 10/26/2016  Admitting diagnosis: PREG Chronic Hypertension, History of Pseudotumor Cerebri Intrauterine pregnancy: [redacted]w[redacted]d      Date of discharge: 10/29/2016    Discharge diagnosis: Term Pregnancy Delivered and POD 3 s/p cesarean section / chronic hypertension - stable       Prenatal history: G2P0010   EDC : 11/06/2016, by Other Basis  Prenatal care at [facility]  Primary provider : Ernestina Penna Prenatal course complicated by anxiety, chronic hypertension, pseudotumor cerebri  Prenatal Labs: ABO, Rh: --/--/O NEG (08/17 1430) / Rhogam antepartum Antibody: POS (08/17 1430) Rubella: Immune (02/08 0000)   RPR: Non Reactive (08/17 1438)  HBsAg: Negative (02/08 0000)  HIV: Non-reactive (02/08 0000)  GBS:                                      Hospital course:  Sceduled C/S   36 y.o. yo G2P0010 at [redacted]w[redacted]d was admitted to the hospital 10/26/2016 for scheduled cesarean section with the following indication:pseudotumor cerebri.  Membrane Rupture Time/Date: 1:49 PM ,10/26/2016   Patient delivered a Viable infant.10/26/2016  Details of operation can be found in separate operative note.  Pateint had an uncomplicated postpartum course.  She is ambulating, tolerating a regular diet, passing flatus, and urinating well. Patient is discharged home in stable condition on  10/29/16   Delivering PROVIDER: Noland Fordyce                                                            Complications: None  Newborn Data: Live born female  Birth Weight: 6 lb 14.4 oz (3130 g) APGAR: 8, 8  Baby Feeding: Breast Disposition:home with mother  Post partum procedures:none (rhogam not indicated PP)  Labs: Lab Results  Component Value Date   WBC 9.4 10/27/2016   HGB 10.7 (L) 10/27/2016   HCT 31.3 (L) 10/27/2016   MCV 85.1 10/27/2016   PLT 160 10/27/2016   CMP Latest Ref Rng & Units  10/23/2016  Glucose 65 - 99 mg/dL 99  BUN 6 - 20 mg/dL 6  Creatinine 6.21 - 3.08 mg/dL 6.57  Sodium 846 - 962 mmol/L 137  Potassium 3.5 - 5.1 mmol/L 4.1  Chloride 101 - 111 mmol/L 104  CO2 22 - 32 mmol/L 23  Calcium 8.9 - 10.3 mg/dL 9.5  Total Protein 6.5 - 8.1 g/dL 7.1  Total Bilirubin 0.3 - 1.2 mg/dL 0.7  Alkaline Phos 38 - 126 U/L 136(H)  AST 15 - 41 U/L 20  ALT 14 - 54 U/L 9(L)      Physical Exam @ time of discharge:  Vitals:   10/27/16 1822 10/28/16 0944 10/28/16 2212 10/29/16 0556  BP: 127/78 130/69 (!) 143/84 139/84  Pulse: 79 74 76 78  Resp: 18   20  Temp: 98.2 F (36.8 C)   99.8 F (37.7 C)  TempSrc: Oral   Oral  SpO2:    99%  Weight:      Height:        General: alert, cooperative and no distress Lochia: appropriate Uterine Fundus: firm Perineum: intact Incision: Healing well with no  significant drainage Extremities: DVT Evaluation: No evidence of DVT seen on physical exam.   Discharge instructions:  "Baby and Me Booklet" and [facility] Booklet  Discharge Medications:  Allergies as of 10/29/2016      Reactions   Lamotrigine Other (See Comments)   Depression   Adhesive [tape] Rash      Medication List    STOP taking these medications   ALPRAZolam 1 MG 24 hr tablet Commonly known as:  XANAX XR     TAKE these medications   busPIRone 10 MG tablet Commonly known as:  BUSPAR Take 10 mg by mouth 2 (two) times daily.   FLUoxetine 40 MG capsule Commonly known as:  PROZAC Take 40 mg by mouth daily.   ibuprofen 600 MG tablet Commonly known as:  ADVIL,MOTRIN Take 1 tablet (600 mg total) by mouth every 6 (six) hours.   IRON PO Take 1 tablet by mouth at bedtime.   labetalol 100 MG tablet Commonly known as:  NORMODYNE Take 100 mg by mouth 2 (two) times daily.   magnesium oxide 400 (241.3 Mg) MG tablet Commonly known as:  MAG-OX Take 1 tablet (400 mg total) by mouth daily.   oxyCODONE-acetaminophen 5-325 MG tablet Commonly known as:   PERCOCET/ROXICET Take 1 tablet by mouth every 4 (four) hours as needed for moderate pain.   PRENATAL VITAMIN PO Take 1 tablet by mouth daily.   ranitidine 150 MG tablet Commonly known as:  ZANTAC Take 150 mg by mouth 2 (two) times daily.   Vitamin D-3 5000 units Tabs Take 5,000 Units by mouth daily.            Discharge Care Instructions        Start     Ordered   10/30/16 0000  magnesium oxide (MAG-OX) 400 (241.3 Mg) MG tablet  Daily    Question:  Supervising Provider  Answer:  Olivia Mackie   10/29/16 1118   10/29/16 0000  ibuprofen (ADVIL,MOTRIN) 600 MG tablet  Every 6 hours    Question:  Supervising Provider  Answer:  Olivia Mackie   10/29/16 1118   10/29/16 0000  Discharge instructions    Comments:  [facility] Discharge Booklet   10/29/16 1118   10/29/16 0000  Call MD for:    Comments:  Heavy bleeding   10/29/16 1118   10/29/16 0000  Call MD for:  temperature >100.4     10/29/16 1118   10/29/16 0000  Call MD for:  severe uncontrolled pain     10/29/16 1118   10/29/16 0000  Call MD for:  redness, tenderness, or signs of infection (pain, swelling, redness, odor or green/yellow discharge around incision site)     10/29/16 1118   10/29/16 0000  Activity as tolerated     10/29/16 1118   10/29/16 0000  Lifting restrictions    Comments:  Weight restriction of 25 lbs.   10/29/16 1118   10/29/16 0000  Driving restriction    Comments:  Avoid driving for at least 2 weeks.   10/29/16 1118   10/29/16 0000  Sexual acrtivity    Comments:  No sex for 6 weeks   10/29/16 1118   10/29/16 0000  Discharge wound care:    Comments:  Keep incision clean and dry   10/29/16 1118   10/29/16 0000  Diet general     10/29/16 1118   10/29/16 0000  oxyCODONE-acetaminophen (PERCOCET/ROXICET) 5-325 MG tablet  Every 4 hours PRN    Question:  Supervising Provider  AnswerOlivia Mackie   10/29/16 1118      Diet: routine diet  Activity: Advance as tolerated. Pelvic rest x 6  weeks.   Follow up:6 weeks    Signed: Marlinda Mike CNM, MSN, [facility] 10/29/2016, 11:19 AM

## 2017-01-29 ENCOUNTER — Encounter (HOSPITAL_COMMUNITY): Payer: Self-pay

## 2021-02-13 ENCOUNTER — Ambulatory Visit: Admit: 2021-02-13 | Payer: PRIVATE HEALTH INSURANCE

## 2021-02-19 ENCOUNTER — Telehealth: Payer: Self-pay

## 2021-02-19 NOTE — Telephone Encounter (Signed)
NOTES SCANNED TO REFERRAL 

## 2021-03-24 ENCOUNTER — Ambulatory Visit (INDEPENDENT_AMBULATORY_CARE_PROVIDER_SITE_OTHER): Payer: Managed Care, Other (non HMO) | Admitting: Cardiovascular Disease

## 2021-03-24 ENCOUNTER — Other Ambulatory Visit: Payer: Self-pay

## 2021-03-24 ENCOUNTER — Encounter: Payer: Self-pay | Admitting: Cardiovascular Disease

## 2021-03-24 VITALS — BP 144/90 | HR 83 | Ht 64.5 in | Wt 272.8 lb

## 2021-03-24 DIAGNOSIS — R002 Palpitations: Secondary | ICD-10-CM

## 2021-03-24 MED ORDER — METOPROLOL TARTRATE 25 MG PO TABS: 25.0000 mg | ORAL_TABLET | Freq: Two times a day (BID) | ORAL | 3 refills | Status: DC

## 2021-03-24 NOTE — Patient Instructions (Signed)
Medication Instructions:  Your physician has recommended you make the following change in your medication:  START Metoprolol Tartrate (Lopressor) 25 mg twice daily  *If you need a refill on your cardiac medications before your next appointment, please call your pharmacy*   Lab Work: None ordered   Testing/Procedures: None ordered   Follow-Up: At [facility], you and your health needs are our priority.  As part of our continuing mission to provide you with exceptional heart care, we have created designated Provider Care Teams.  These Care Teams include your primary Cardiologist (physician) and Advanced Practice Providers (APPs -  Physician Assistants and Nurse Practitioners) who all work together to provide you with the care you need, when you need it.  Your next appointment:   3 month(s)  The format for your next appointment:   In Person  Provider:   Dr Elease Hashimoto    Thank you for choosing [facility]!!   [phone]   Other Instructions   Metoprolol Tablets What is this medication? METOPROLOL (me TOE proe lole) treats high blood pressure. It also prevents chest pain (angina) or further damage after a heart attack. It works by lowering your blood pressure and heart rate, making it easier for your heart to pump blood to the rest of your body. It belongs to a group of medications called beta blockers. This medicine may be used for other purposes; ask your health care provider or pharmacist if you have questions. COMMON BRAND NAME(S): Lopressor What should I tell my care team before I take this medication? They need to know if you have any of these conditions: Diabetes Heart or vessel disease like slow heart rate, worsening heart failure, heart block, sick sinus syndrome, or Raynaud's disease Kidney disease Liver disease Lung or breathing disease, like asthma or emphysema Pheochromocytoma Thyroid disease An unusual or allergic reaction to metoprolol, other beta  blockers, medications, foods, dyes, or preservatives Pregnant or trying to get pregnant Breast-feeding How should I use this medication? Take this medication by mouth with water. Take it as directed on the prescription label at the same time every day. You can take it with or without food. You should always take it the same way. Keep taking it unless your care team tells you to stop. Talk to your care team about the use of this medication in children. Special care may be needed. Overdosage: If you think you have taken too much of this medicine contact a poison control center or emergency room at once. NOTE: This medicine is only for you. Do not share this medicine with others. What if I miss a dose? If you miss a dose, take it as soon as you can. If it is almost time for your next dose, take only that dose. Do not take double or extra doses. What may interact with this medication? This medication may interact with the following: Certain medications for blood pressure, heart disease, irregular heartbeat Certain medications for depression like monoamine oxidase (MAO) inhibitors, fluoxetine, or paroxetine Clonidine Dobutamine Epinephrine Isoproterenol Reserpine This list may not describe all possible interactions. Give your health care provider a list of all the medicines, herbs, non-prescription drugs, or dietary supplements you use. Also tell them if you smoke, drink alcohol, or use illegal drugs. Some items may interact with your medicine. What should I watch for while using this medication? Visit your care team for regular checks on your progress. Check your blood pressure as directed. Ask your care team what your blood pressure  should be. Also, find out when you should contact them. Do not treat yourself for coughs, colds, or pain while you are using this medication without asking your care team for advice. Some medications may increase your blood pressure. You may get drowsy or dizzy. Do  not drive, use machinery, or do anything that needs mental alertness until you know how this medication affects you. Do not stand up or sit up quickly, especially if you are an older patient. This reduces the risk of dizzy or fainting spells. Alcohol may interfere with the effect of this medication. Avoid alcoholic drinks. This medication may increase blood sugar. Ask your care team if changes in diet or medications are needed if you have diabetes. What side effects may I notice from receiving this medication? Side effects that you should report to your care team as soon as possible: Allergic reactions--skin rash, itching, hives, swelling of the face, lips, tongue, or throat Heart failure--shortness of breath, swelling of the ankles, feet, or hands, sudden weight gain, unusual weakness or fatigue Low blood pressure--dizziness, feeling faint or lightheaded, blurry vision Raynaud's--cool, numb, or painful fingers or toes that may change color from pale, to blue, to red Slow heartbeat--dizziness, feeling faint or lightheaded, confusion, trouble breathing, unusual weakness or fatigue Worsening mood, feelings of depression Side effects that usually do not require medical attention (report to your care team if they continue or are bothersome): Change in sex drive or performance Diarrhea Dizziness Fatigue Headache This list may not describe all possible side effects. Call your doctor for medical advice about side effects. You may report side effects to FDA at 1-800-FDA-1088. Where should I keep my medication? Keep out of the reach of children and pets. Store at room temperature between 15 and 30 degrees C (59 and 86 degrees F). Protect from moisture. Keep the container tightly closed. Throw away any unused medication after the expiration date. NOTE: This sheet is a summary. It may not cover all possible information. If you have questions about this medicine, talk to your doctor, pharmacist, or health  care provider.  2022 Elsevier/Gold Standard (2020-11-12 00:00:00)

## 2021-03-24 NOTE — Progress Notes (Signed)
Cardiology Office Note:    Date:  03/24/2021   ID:  [name], DOB 01/20/1981, MRN [id number]  PCP:  [name], Lisa, MD   [facility] Providers Cardiologist:  Alazay Leicht     Referring MD: [name], Lisa, MD   Chief Complaint  Patient presents with   Tachycardia          History of Present Illness:    [name] is a 41 y.o. female with a hx of tachycardia  We were asked to see her by Dr. 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 Episode myight last 10-15 minutes .  Depends on if she can do deep breathing and calm her self down.   Takes propranolol 40 mg QAM  Helps for several hours - typically wears off by 11 AM  We discussed starting her on a long acting beta blocker  Has hx of HTN Admits that she eats more salt than she should.   Does not limit her carbs   Had a sleep study years ago .  Was given CPAP ,  she did not notice any benefit.    Sees 2 therapist and a psychiatrist where he rested okay you wake up gasping for air think continue to sleep study   Past Medical History:  Diagnosis Date   Anemia    Anxiety    Cystic hygroma of fetus in singleton pregnancy    Depression    GERD (gastroesophageal reflux disease)    H/O infertility    Headache    Heart murmur    Hypertension    PCOS (polycystic ovarian syndrome)    Pseudotumor cerebri     Past Surgical History:  Procedure Laterality Date   CESAREAN SECTION N/A 10/26/2016   Procedure: Primary CESAREAN SECTION;  Surgeon: Noland FordyceFogleman, Kelly, MD;  Location: [facility];  Service: Obstetrics;  Laterality: N/A;  EDD: 11/06/16   CHOLECYSTECTOMY     DILATION AND EVACUATION N/A 06/21/2014   Procedure: DILATATION AND EVACUATION (D&E) 2ND TRIMESTER;  Surgeon: Noland FordyceKelly Fogleman,  MD;  Location: [facility];  Service: Gynecology;  Laterality: N/A;   GASTRECTOMY     OPERATIVE ULTRASOUND N/A 06/21/2014   Procedure: OPERATIVE ULTRASOUND;  Surgeon: Noland FordyceKelly Fogleman, MD;  Location: [facility];  Service: Gynecology;  Laterality: N/A;    Current Medications: Current Meds  Medication Sig   clonazePAM (KLONOPIN) 0.5 MG tablet Take 0.5 mg by mouth 2 (two) times daily as needed for anxiety.   Ibuprofen-Acetaminophen (ADVIL DUAL ACTION PO) Take 2 tablets by mouth 2 (two) times daily.   metoprolol tartrate (LOPRESSOR) 25 MG tablet Take 1 tablet (25 mg total) by mouth 2 (two) times daily.   propranolol (INDERAL) 20 MG tablet Take 20 mg by mouth daily as needed.     Allergies:   Lamotrigine and Adhesive [tape]   Social History   Socioeconomic History   Marital status: Married    Spouse name: Not on file   Number of children: Not on file   Years of education: Not on file   Highest education level: Not on file  Occupational History   Not on file  Tobacco Use   Smoking status: Never   Smokeless tobacco: Never  Substance and Sexual Activity   Alcohol use: No  Drug use: No   Sexual activity: Yes  Other Topics Concern   Not on file  Social History Narrative   Not on file   Social Determinants of Health   Financial Resource Strain: Not on file  Food Insecurity: Not on file  Transportation Needs: Not on file  Physical Activity: Not on file  Stress: Not on file  Social Connections: Not on file     Family History: The patient's family history includes Breast cancer in her paternal aunt; Cervical cancer in her mother; Colon cancer in her maternal grandfather and paternal grandfather; Diabetes in her father and paternal grandfather; Heart disease in her father, maternal grandfather, paternal grandfather, and paternal grandmother.  ROS:   Please see the history of present illness.     All other systems reviewed and are negative.  EKGs/Labs/Other Studies Reviewed:    The  following studies were reviewed today:   EKG: March 24, 2021: Normal sinus rhythm at 83.  No ST or T wave changes.  Recent Labs: No results found for requested labs within last 8760 hours.  Recent Lipid Panel    Component Value Date/Time   CHOL 186 04/25/2009 0842   TRIG 151.0 (H) 04/25/2009 0842   HDL 43.00 04/25/2009 0842   CHOLHDL 4 04/25/2009 0842   VLDL 30.2 04/25/2009 0842   LDLCALC 113 (H) 04/25/2009 0842     Risk Assessment/Calculations:           Physical Exam:    VS:  BP (!) 144/90 (BP Location: Left Arm, Patient Position: Sitting, Cuff Size: Large)    Pulse 83    Ht 5' 4.5" (1.638 m)    Wt 272 lb 12.8 oz (123.7 kg)    SpO2 99%    BMI 46.10 kg/m     Wt Readings from Last 3 Encounters:  03/24/21 272 lb 12.8 oz (123.7 kg)  10/26/16 243 lb (110.2 kg)  10/09/16 239 lb (108.4 kg)     GEN: morbidly obese female,   in no acute distress HEENT: Normal NECK: No JVD; No carotid bruits LYMPHATICS: No lymphadenopathy CARDIAC: RRR, very soft systolic murmur,  rubs, gallops RESPIRATORY:  Clear to auscultation without rales, wheezing or rhonchi  ABDOMEN: Soft, non-tender, non-distended MUSCULOSKELETAL:  No edema; No deformity  SKIN: Warm and dry NEUROLOGIC:  Alert and oriented x 3 PSYCHIATRIC:  Normal affect   ASSESSMENT:    1. Palpitations    PLAN:      Tachycardia: [name]  presents for further evaluation of possible inappropriate tachycardia, possible POTS.  She has history of depression, anxiety, orbit obesity, She has propranolol that she takes on an as-needed basis for palpitations.  She usually takes in the morning and it wears off by about 11.  We will try her on metoprolol 25 mg twice a day.  I have given her the okay to continue using the propranolol in addition to the metoprolol.  We will see which one seems to work better.  She may in fact have some degree of dysautonomia.  She has been diagnosed with hypermobile Ehlers-Danlos which apparently is  associated with increased risk of dysautonomia.  At present she has so many issues that it appears that she has not really been stable from a mental health standpoint.  She has been tried on numerous medications and her medication list seems to be changing fairly frequently.  I think that she would do well to start a regular exercise program.  I would pick something that she can  tolerate without causing issues with her knees or other arthritic issues.  I have asked her to work on weight loss. She needs to work on calorie reduction, carbohydrate reduction, salt reduction   I will see her in 3 months for follow-up visit.             Medication Adjustments/Labs and Tests Ordered: Current medicines are reviewed at length with the patient today.  Concerns regarding medicines are outlined above.  Orders Placed This Encounter  Procedures   EKG 12-Lead   Meds ordered this encounter  Medications   metoprolol tartrate (LOPRESSOR) 25 MG tablet    Sig: Take 1 tablet (25 mg total) by mouth 2 (two) times daily.    Dispense:  60 tablet    Refill:  3    Patient Instructions  Medication Instructions:  Your physician has recommended you make the following change in your medication:  START Metoprolol Tartrate (Lopressor) 25 mg twice daily  *If you need a refill on your cardiac medications before your next appointment, please call your pharmacy*   Lab Work: None ordered   Testing/Procedures: None ordered   Follow-Up: At [facility], you and your health needs are our priority.  As part of our continuing mission to provide you with exceptional heart care, we have created designated Provider Care Teams.  These Care Teams include your primary Cardiologist (physician) and Advanced Practice Providers (APPs -  Physician Assistants and Nurse Practitioners) who all work together to provide you with the care you need, when you need it.  Your next appointment:   3 month(s)  The format for  your next appointment:   In Person  Provider:   Dr Elease Hashimoto    Thank you for choosing [facility]!!   [phone]   Other Instructions   Metoprolol Tablets What is this medication? METOPROLOL (me TOE proe lole) treats high blood pressure. It also prevents chest pain (angina) or further damage after a heart attack. It works by lowering your blood pressure and heart rate, making it easier for your heart to pump blood to the rest of your body. It belongs to a group of medications called beta blockers. This medicine may be used for other purposes; ask your health care provider or pharmacist if you have questions. COMMON BRAND NAME(S): Lopressor What should I tell my care team before I take this medication? They need to know if you have any of these conditions: Diabetes Heart or vessel disease like slow heart rate, worsening heart failure, heart block, sick sinus syndrome, or Raynaud's disease Kidney disease Liver disease Lung or breathing disease, like asthma or emphysema Pheochromocytoma Thyroid disease An unusual or allergic reaction to metoprolol, other beta blockers, medications, foods, dyes, or preservatives Pregnant or trying to get pregnant Breast-feeding How should I use this medication? Take this medication by mouth with water. Take it as directed on the prescription label at the same time every day. You can take it with or without food. You should always take it the same way. Keep taking it unless your care team tells you to stop. Talk to your care team about the use of this medication in children. Special care may be needed. Overdosage: If you think you have taken too much of this medicine contact a poison control center or emergency room at once. NOTE: This medicine is only for you. Do not share this medicine with others. What if I miss a dose? If you miss a dose, take it as soon as  you can. If it is almost time for your next dose, take only that dose. Do not take  double or extra doses. What may interact with this medication? This medication may interact with the following: Certain medications for blood pressure, heart disease, irregular heartbeat Certain medications for depression like monoamine oxidase (MAO) inhibitors, fluoxetine, or paroxetine Clonidine Dobutamine Epinephrine Isoproterenol Reserpine This list may not describe all possible interactions. Give your health care provider a list of all the medicines, herbs, non-prescription drugs, or dietary supplements you use. Also tell them if you smoke, drink alcohol, or use illegal drugs. Some items may interact with your medicine. What should I watch for while using this medication? Visit your care team for regular checks on your progress. Check your blood pressure as directed. Ask your care team what your blood pressure should be. Also, find out when you should contact them. Do not treat yourself for coughs, colds, or pain while you are using this medication without asking your care team for advice. Some medications may increase your blood pressure. You may get drowsy or dizzy. Do not drive, use machinery, or do anything that needs mental alertness until you know how this medication affects you. Do not stand up or sit up quickly, especially if you are an older patient. This reduces the risk of dizzy or fainting spells. Alcohol may interfere with the effect of this medication. Avoid alcoholic drinks. This medication may increase blood sugar. Ask your care team if changes in diet or medications are needed if you have diabetes. What side effects may I notice from receiving this medication? Side effects that you should report to your care team as soon as possible: Allergic reactions--skin rash, itching, hives, swelling of the face, lips, tongue, or throat Heart failure--shortness of breath, swelling of the ankles, feet, or hands, sudden weight gain, unusual weakness or fatigue Low blood  pressure--dizziness, feeling faint or lightheaded, blurry vision Raynaud's--cool, numb, or painful fingers or toes that may change color from pale, to blue, to red Slow heartbeat--dizziness, feeling faint or lightheaded, confusion, trouble breathing, unusual weakness or fatigue Worsening mood, feelings of depression Side effects that usually do not require medical attention (report to your care team if they continue or are bothersome): Change in sex drive or performance Diarrhea Dizziness Fatigue Headache This list may not describe all possible side effects. Call your doctor for medical advice about side effects. You may report side effects to FDA at 1-800-FDA-1088. Where should I keep my medication? Keep out of the reach of children and pets. Store at room temperature between 15 and 30 degrees C (59 and 86 degrees F). Protect from moisture. Keep the container tightly closed. Throw away any unused medication after the expiration date. NOTE: This sheet is a summary. It may not cover all possible information. If you have questions about this medicine, talk to your doctor, pharmacist, or health care provider.  2022 Elsevier/Gold Standard (2020-11-12 00:00:00)      Signed, Kristeen Miss, MD  03/24/2021 5:34 PM    [facility]

## 2021-06-06 ENCOUNTER — Encounter: Payer: Self-pay | Admitting: Cardiology

## 2021-06-06 ENCOUNTER — Inpatient Hospital Stay: Payer: Managed Care, Other (non HMO)

## 2021-06-06 ENCOUNTER — Ambulatory Visit: Payer: Managed Care, Other (non HMO) | Admitting: Cardiology

## 2021-06-06 VITALS — BP 124/84 | HR 100 | Temp 98.6°F | Resp 16 | Ht 64.0 in | Wt 280.0 lb

## 2021-06-06 DIAGNOSIS — I10 Essential (primary) hypertension: Secondary | ICD-10-CM

## 2021-06-06 DIAGNOSIS — R42 Dizziness and giddiness: Secondary | ICD-10-CM

## 2021-06-06 DIAGNOSIS — R002 Palpitations: Secondary | ICD-10-CM

## 2021-06-06 DIAGNOSIS — E559 Vitamin D deficiency, unspecified: Secondary | ICD-10-CM

## 2021-06-06 DIAGNOSIS — Z8249 Family history of ischemic heart disease and other diseases of the circulatory system: Secondary | ICD-10-CM

## 2021-06-06 NOTE — Telephone Encounter (Signed)
Labs 11/01/2020: ? ?Hb 14.6/HCT 43.9, platelets 219. ? ?Serum glucose 89, BUN 11, creatinine 0.67, EGFR 115 mL, potassium 4.4.  LFTs normal. ? ?Total cholesterol 210, triglycerides 193, HDL 49, LDL 126. ? ?Vitamin D18.1. ?

## 2021-06-06 NOTE — Progress Notes (Signed)
? ?Primary Physician/Referring:  Bunnie Pion, FNP ? ?Patient ID: [name], female    DOB: 12-22-1980, 41 y.o.   MRN: [id number] ? ?Chief Complaint  ?Patient presents with  ? Palpitations  ? New Patient (Initial Visit)  ? ehlers-danios syndrome  ? ?HPI:   ? ?[name]  is a 41 y.o. 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 Liam Rogers on 03/24/2021 and was started on a beta-blocker dose.  She is now referred for a second opinion after request from the patient by Dr. Larina Earthly for evaluation of palpitations, dysautonomia.  Patient is worried that she may have significant dysautonomia causing her variegate symptoms. ? 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  Patient symptoms of sudden onset dizziness and marked weakness.  Episodes occur almost on a daily basis especially in the morning when she wakes up but are much more severe at least 2-3 times a month where she feels she has to lay down on the ground and finds it hard to get up. ?She is accompanied by her husband. ? ?Past Medical History:  ?Diagnosis Date  ? Anemia   ? Anxiety   ? Cystic hygroma of fetus in singleton pregnancy   ? Depression   ? GERD (gastroesophageal reflux disease)   ? H/O infertility   ? Headache   ? Heart murmur   ? Hypertension   ? PCOS (polycystic ovarian syndrome)   ? Pseudotumor cerebri   ? ?Past Surgical History:  ?Procedure Laterality Date  ? CESAREAN SECTION N/A 10/26/2016  ? Procedure: Primary CESAREAN SECTION;  Surgeon: Aloha Gell, MD;  Location: [facility];  Service: Obstetrics;  Laterality: N/A;  EDD: 11/06/16  ? CHOLECYSTECTOMY  04/2008  ? DILATION AND EVACUATION N/A 06/21/2014  ? Procedure: DILATATION AND EVACUATION (D&E) 2ND TRIMESTER;  Surgeon: Aloha Gell, MD;  Location: [facility];  Service: Gynecology;  Laterality: N/A;  ? GASTRECTOMY    ? OPERATIVE ULTRASOUND N/A 06/21/2014  ? Procedure: OPERATIVE ULTRASOUND;  Surgeon: Aloha Gell, MD;  Location: [facility];  Service: Gynecology;  Laterality: N/A;  ? ?Family History  ?Problem Relation Age of Onset  ? Hyperlipidemia Mother   ? Hypertension Mother   ? Cervical cancer Mother   ? Heart disease Father 71  ?     MI and died at age 39Y  ? Diabetes Father   ? Hypertension Brother   ?     2.5 years younger to patient  ? Breast cancer Paternal Aunt   ? Heart disease Maternal Grandfather   ? Colon cancer Maternal Grandfather   ? Heart disease Paternal Grandmother   ? Heart disease Paternal Grandfather   ? Diabetes Paternal Grandfather   ? Colon cancer Paternal Grandfather   ?  ?Social History  ? ?Tobacco Use  ? Smoking status: Never  ? Smokeless tobacco: Never  ?Substance Use Topics  ? Alcohol use: No  ? ?Marital Status: Married  ?ROS  ?Review of Systems  ?Cardiovascular:  Positive for palpitations. Negative for chest pain, dyspnea on exertion and leg swelling.  ?Neurological:  Positive for dizziness, light-headedness and weakness.  ?Objective  ?Blood pressure 124/84, pulse 100, temperature 98.6 ?F (37 ?C), resp. rate 16, height $RemoveBe'5\' 4"'DGmjRRwea$  (1.626 m), weight 280 lb (127 kg), SpO2 98 %, unknown if currently breastfeeding. Body mass  index is 48.06 kg/m?.  ? ?  06/06/2021  ?  1:42 PM 03/24/2021  ?  3:04 PM 10/29/2016  ?  5:56 AM  ?Vitals with BMI  ?Height $Remove'5\' 4"'CGOJWsM$  5' 4.5"   ?Weight 280 lbs 272 lbs 13 oz   ?BMI 48.04 46.12   ?Systolic 981 191 478  ?Diastolic 84 90 84  ?Pulse 100 83 78  ?  ?Orthostatic VS for the past 72 hrs (Last 3 readings): ? Orthostatic BP Patient Position BP Location Cuff Size Orthostatic Pulse  ?06/06/21 1413 (!) 150/98 Standing Left Arm Large 82  ?06/06/21 1412 (!) 164/98 Sitting Left Arm Large 81  ?06/06/21 1409 (!) 159/94 Supine Left Arm Large 74  ? ?Physical Exam ?Constitutional:   ?   Appearance: She is  morbidly obese.  ?Neck:  ?   Vascular: No JVD.  ?Cardiovascular:  ?   Rate and Rhythm: Normal rate and regular rhythm.  ?   Pulses: Intact distal pulses.  ?   Heart sounds: Normal heart sounds. No murmur heard. ?  No gallop.  ?Pulmonary:  ?   Effort: Pulmonary effort is normal.  ?   Breath sounds: Normal breath sounds.  ?Abdominal:  ?   General: Bowel sounds are normal.  ?   Palpations: Abdomen is soft.  ?Musculoskeletal:  ?   Right lower leg: No edema.  ?   Left lower leg: No edema.  ?  ? ?Medications and allergies  ? ?Allergies  ?Allergen Reactions  ? Lamotrigine Other (See Comments)  ?  Depression  ? Adhesive [Tape] Rash  ?  ?Medication list after today's encounter  ? ?Current Outpatient Medications:  ?  clonazePAM (KLONOPIN) 0.5 MG tablet, Take 0.5 mg by mouth 2 (two) times daily as needed for anxiety., Disp: , Rfl:  ?  DULoxetine HCl 40 MG CPEP, Take 1 capsule by mouth daily., Disp: , Rfl:  ?  Ibuprofen-Acetaminophen (ADVIL DUAL ACTION PO), Take 2 tablets by mouth 2 (two) times daily., Disp: , Rfl:  ?  propranolol (INDERAL) 40 MG tablet, Take 40 mg by mouth 2 (two) times daily., Disp: , Rfl:  ?  VYVANSE 50 MG capsule, Take 50 mg by mouth daily., Disp: , Rfl:  ? ?Laboratory examination:  ? ?External labs:  ? ?Labs 11/01/2020: ? ?Hb 14.6/HCT 43.9, platelets 219. ? ?Serum glucose 89, BUN 11, creatinine 0.67, EGFR 115 mL, potassium 4.4.  LFTs normal. ? ?Total cholesterol 210, triglycerides 193, HDL 49, LDL 126. ? ?Vitamin D18.1. ? ?Radiology:  ? ?Cardiac Studies:  ? ?NA ? ?EKG:  ? ?EKG 06/06/2021: Normal sinus rhythm at rate of 84 bpm, normal axis.  No evidence of ischemia, normal EKG. No significant change from  EKG 03/24/2021:  ? ?Assessment  ? ?  ICD-10-CM   ?1. Palpitations  R00.2 EKG 12-Lead  ?  LONG TERM MONITOR (3-14 DAYS)  ?  ?2. Orthostatic lightheadedness  R42 PCV ECHOCARDIOGRAM COMPLETE  ?  LONG TERM MONITOR (3-14 DAYS)  ?  ?3. Family history of premature CAD father MI at ag 68 Y  Z27.49   ?  ?4. Primary  hypertension  I10 PCV ECHOCARDIOGRAM COMPLETE  ?  ?5. Hypovitaminosis D  E55.9   ?  ?  ?No orders of the defined types were placed in this encounter. ? ?Orders Placed This Encounter  ?Procedures  ? LONG TERM MONITOR (3-14 DAYS)  ?  Standing Status:   Future  ?  Number of Occurrences:   1  ?  Order Specific  Question:   Where should this test be performed?  ?  Answer:   [facility]  ?  Order Specific Question:   Does the patient have an implanted cardiac device?  ?  Answer:   No  ?  Order Specific Question:   Prescribed days of wear  ?  Answer:   14  ?  Order Specific Question:   Type of enrollment  ?  Answer:   Clinic Enrollment  ?  Order Specific Question:   Release to patient  ?  Answer:   Immediate  ? EKG 12-Lead  ? PCV ECHOCARDIOGRAM COMPLETE  ?  Standing Status:   Future  ?  Standing Expiration Date:   06/07/2022  ? ?Recommendations:  ? ?[name] is a 41 y.o.  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 Liam Rogers on 03/24/2021 and was started on a beta-blocker dose.  She is now referred for a second opinion after request from the patient by Dr. Larina Earthly for evaluation of palpitations, dysautonomia.  Patient is worried that she may have significant dysautonomia causing her variegate symptoms. ? 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  Patient symptoms of sudden onset dizziness and marked weakness.  Episodes occur almost on a daily basis especially in the morning when she wakes up but are much more severe at least 2-3 times a month where she feels she has to lay down on the ground and finds it hard to get up. ? ?Extremely difficult to delineate her symptoms.  I will perform extended EKG monitoring for correlation.  Obtain an echocardiogram to exclude  structural abnormality.  Physical examination except for morbid obesity is unremarkable and she is negative for orthostasis. ? ?She has hypertension.  She has had multiple elevations in blood pressure that I can tel

## 2021-06-06 NOTE — Telephone Encounter (Signed)
From pt

## 2021-06-12 ENCOUNTER — Ambulatory Visit: Payer: Managed Care, Other (non HMO)

## 2021-06-12 DIAGNOSIS — R42 Dizziness and giddiness: Secondary | ICD-10-CM

## 2021-06-12 DIAGNOSIS — I10 Essential (primary) hypertension: Secondary | ICD-10-CM

## 2021-06-23 ENCOUNTER — Ambulatory Visit: Payer: Managed Care, Other (non HMO) | Admitting: Cardiovascular Disease

## 2021-06-26 ENCOUNTER — Encounter: Payer: Self-pay | Admitting: Cardiology

## 2021-06-26 NOTE — Telephone Encounter (Signed)
From patient.

## 2021-07-10 ENCOUNTER — Encounter: Payer: Self-pay | Admitting: Cardiology

## 2021-07-10 ENCOUNTER — Ambulatory Visit: Payer: Managed Care, Other (non HMO) | Admitting: Cardiology

## 2021-07-10 VITALS — BP 123/83 | HR 79 | Temp 98.2°F | Resp 17 | Ht 64.0 in | Wt 280.2 lb

## 2021-07-10 DIAGNOSIS — I10 Essential (primary) hypertension: Secondary | ICD-10-CM

## 2021-07-10 DIAGNOSIS — R002 Palpitations: Secondary | ICD-10-CM

## 2021-07-10 NOTE — Patient Instructions (Signed)
PAC = Premature atrial complexes: These arise from the upper chamber of the heart.  These are very common and are not dangerous.  Extra skipped beat coming from the upper chamber (atrium) and mostly are life altering (nuisance) than life threatening and mostly treated by reassurance.  There may not be any specific reasons for this, however patients with excessive caffeine, anxiety, lack of sleep, alcohol or thyroid problems can have these episodes.   Premature ventricular complexes (PVC) means extra heartbeat from the lower chamber of the heart.  These are very common and are not dangerous.  Extra skipped beat coming from the bottom chamber (ventricle) and mostly are life altering (nuisance) than life threatening and mostly treated by reassurance.  There may not be any specific reasons for this, however patients with excessive caffeine, anxiety, lack of sleep, alcohol or thyroid problems can have these episodes.  Rarely patients with block coronary arteries or family history of heart muscle disease may be the reason.  If more questions, he can discuss with her doctor on office visit.  

## 2021-07-10 NOTE — Progress Notes (Signed)
? ?Primary Physician/Referring:  Diana Pion, FNP ? ?Patient ID: [name], female    DOB: [birth year], 41 y.o.   MRN: [id number] ? ?Chief Complaint  ?Patient presents with  ? Palpitations  ? Dizziness  ? Hypertension  ? ?HPI:   ? ?[name]  is a 41 y.o. 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 Liam Rogers on 03/24/2021 and was started on a beta-blocker dose.  She is now referred for a second opinion after request from the patient by Dr. Larina Earthly for evaluation of palpitations, dysautonomia.  Patient is worried that she may have significant dysautonomia causing her variegate symptoms. ? 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  Patient symptoms of sudden onset dizziness and marked weakness.  Episodes occur almost on a daily basis especially in the morning when she wakes up but are much more severe at least 2-3 times a month where she feels she has to lay down on the ground and finds it hard to get up. ? ?I had seen her 3 months ago, she underwent echocardiogram and also extended EKG monitoring and presents for follow-up.  No change in his symptomatology, she has not had any frank syncope.  States that she had developed severe vertigo and gait imbalance and has been evaluated by neurology was recommended LP. ? ?Past Medical History:  ?Diagnosis Date  ? Anemia   ? Anxiety   ? Cystic hygroma of fetus in singleton pregnancy   ? Depression   ? GERD (gastroesophageal reflux disease)   ? H/O infertility   ? Headache   ? Heart murmur   ? Hypertension   ? PCOS (polycystic ovarian syndrome)   ? Pseudotumor cerebri   ? ? ?ROS  ?Review of Systems  ?Cardiovascular:  Positive for palpitations. Negative for chest pain, dyspnea on exertion and leg swelling.   ?Neurological:  Positive for dizziness, light-headedness and weakness.  ?Objective  ?Blood pressure 123/83, pulse 79, temperature 98.2 ?F (36.8 ?C), temperature source Temporal, resp. rate 17, height _0  (1.626 m), weight 280 lb 3.2 oz (127.1 kg), SpO2 98 %, unknown if currently breastfeeding. Body mass index is 48.1 kg/m?.  ? ?  07/10/2021  ?  2:24 PM 06/06/2021  ?  1:42 PM 03/24/2021  ?  3:04 PM  ?Vitals with BMI  ?Height _1  _2  5' 4.5"  ?Weight 280 lbs 3 oz 280 lbs 272 lbs 13 oz  ?BMI 48.07 48.04 46.12  ?Systolic 175 102 585  ?Diastolic 83 84 90  ?Pulse 79 100 83  ?  ?Orthostatic VS for the past 72 hrs (Last 3 readings): ? Patient Position BP Location Cuff Size  ?07/10/21 1424 Sitting Left Arm Large  ? ?Physical Exam ?Constitutional:   ?   Appearance: She is morbidly obese.  ?Neck:  ?   Vascular: No JVD.  ?Cardiovascular:  ?   Rate and Rhythm: Normal rate and regular rhythm.  ?   Pulses: Intact distal pulses.  ?   Heart sounds: Normal heart sounds. No murmur heard. ?  No gallop.  ?Pulmonary:  ?   Effort: Pulmonary effort is normal.  ?   Breath sounds: Normal breath sounds.  ?Abdominal:  ?   General: Bowel sounds are normal.  ?   Palpations: Abdomen is soft.  ?Musculoskeletal:  ?  Right lower leg: No edema.  ?   Left lower leg: No edema.  ?  ? ?Medications and allergies  ? ?Allergies  ?Allergen Reactions  ? Lamotrigine Other (See Comments)  ?  Depression  ? Adhesive [Tape] Rash  ?  ?Medication list after today's encounter  ? ?Current Outpatient Medications:  ?  clonazePAM (KLONOPIN) 0.5 MG tablet, Take 0.5 mg by mouth 2 (two) times daily as needed for anxiety., Disp: , Rfl:  ?  DULoxetine (CYMBALTA) 60 MG capsule, Take 60 mg by mouth daily., Disp: , Rfl:  ?  Ibuprofen-Acetaminophen (ADVIL DUAL ACTION PO), Take 2 tablets by mouth 2 (two) times daily., Disp: , Rfl:  ?  propranolol (INDERAL) 40 MG tablet, Take 40 mg by mouth 2 (two) times daily., Disp: , Rfl:  ?  VYVANSE 50 MG capsule, Take 50 mg by mouth  daily., Disp: , Rfl:  ? ?Laboratory examination:  ? ?External labs:  ? ?Labs 11/01/2020: ? ?Hb 14.6/HCT 43.9, platelets 219. ? ?Serum glucose 89, BUN 11, creatinine 0.67, EGFR 115 mL, potassium 4.4.  LFTs normal. ? ?Total cholesterol 210, triglycerides 193, HDL 49, LDL 126. ? ?Vitamin D18.1. ? ?Radiology:  ? ?Cardiac Studies:  ?PCV ECHOCARDIOGRAM COMPLETE 06/12/2021  ?Normal LV systolic function with visual EF 60-65%. Left ventricle cavity is normal in size. Normal left ventricular wall thickness. Normal global wall motion. Normal diastolic filling pattern, normal LAP. ?No significant valvular heart disease. ?No prior study for comparison. ?   ?[facility] Patch Extended out patient EKG monitoring 14 days starting 06/06/2021: ?Predominant rhythm is sinus rhythm.  There were 3 brief atrial tachycardia episodes, fastest and longest 8 beats.  There were occasional PACs and PVCs. ?There were 21 patient triggered events revealing essentially normal sinus rhythm with occasional PACs.  Mostly symptoms correlated with normal sinus rhythm.  No other significant arrhythmias.   ? ?EKG:  ? ?EKG 06/06/2021: Normal sinus rhythm at rate of 84 bpm, normal axis.  No evidence of ischemia, normal EKG. No significant change from  EKG 03/24/2021:  ? ?Assessment  ? ?  ICD-10-CM   ?1. Palpitations  R00.2   ?  ?2. Primary hypertension  I10   ?  ?  ?No orders of the defined types were placed in this encounter. ? ?No orders of the defined types were placed in this encounter. ? ?Recommendations:  ? ?Diana Hemmingway is a 41 y.o.  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 Liam Rogers on 03/24/2021 and was started on a beta-blocker dose.  She is now referred for a second opinion after request from the patient by Dr. Larina Earthly for evaluation of palpitations, dysautonomia.  Patient is worried that she may  have significant dysautonomia causing her variegate symptoms. ? 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  Patient symptoms of sudden onset dizziness and marked weakness.  Episodes occur almost on a daily basis especially in the morning when she wakes up but are much more severe at least 2-3 times a month where she feels she has to lay down on the ground and finds it hard to get up. ? ?I had seen her 3 months ago, she underwent echocardiogram and also extended EKG monitoring and presents for follow-up.  I reviewed the results of the extended EKG monitoring, she has had 21 patient activated  events revealing normal sinus rhythm with rare PACs and PVCs.  Hence do not suspect her symptoms are cardiac related.  In the interim since last office visit, she is seen by neurology for vertigo and she has been scheduled for lumbar puncture to evaluate for normal pressure hydrocephalus versus pseudotumor cerebri evaluation. ? ?Echocardiogram is essentially normal.  There is no aortic root dilatation.  ? ?She is concerned about inability to lose weight, flushing symptoms, she is wondering whether she is premenopausal.  I will request Dr. Larina Earthly to see whether evaluating her for perimenopausal syndrome would be appropriate due to her concerns. ? ?She has family history of premature coronary artery disease.  I reviewed her labs, has mixed hyperlipidemia.  I would have a low threshold to start on statin therapy but I am concerned about side effects in view of questionable fibromyalgia history, ongoing diffuse nonspecific systemic symptoms.  I also believe that weight loss and exercise will certainly improve her lipid profile. Patient has had gastric bypass surgery but unfortunately has gained all her weight back.    ? ?Otherwise from cardiac standpoint, she is stable, I will see her back on a as needed basis. ? ? ? ?Adrian Prows, MD,  [facility] ?07/10/2021, 2:36 PM ?Office: [phone]     CC Dr. Larina Earthly ?

## 2024-03-28 ENCOUNTER — Ambulatory Visit: Admitting: Family Medicine
